# Patient Record
Sex: Female | Born: 1960 | Race: White | Hispanic: No | Marital: Married | Smoking: Never smoker
Health system: Southern US, Community
[De-identification: ages and names within clinical notes are randomized; demographics above are authoritative.]

---

## 1990-11-18 HISTORY — PX: LIVER RESECTION: SHX1977

## 1998-04-25 ENCOUNTER — Ambulatory Visit (HOSPITAL_COMMUNITY): Admission: RE | Admit: 1998-04-25 | Discharge: 1998-04-25 | Payer: Self-pay | Admitting: Obstetrics and Gynecology

## 1998-04-25 ENCOUNTER — Other Ambulatory Visit: Admission: RE | Admit: 1998-04-25 | Discharge: 1998-04-25 | Payer: Self-pay | Admitting: Obstetrics and Gynecology

## 2001-07-24 ENCOUNTER — Other Ambulatory Visit: Admission: RE | Admit: 2001-07-24 | Discharge: 2001-07-24 | Payer: Self-pay | Admitting: Obstetrics and Gynecology

## 2002-11-23 ENCOUNTER — Other Ambulatory Visit: Admission: RE | Admit: 2002-11-23 | Discharge: 2002-11-23 | Payer: Self-pay | Admitting: Obstetrics and Gynecology

## 2004-02-09 ENCOUNTER — Other Ambulatory Visit: Admission: RE | Admit: 2004-02-09 | Discharge: 2004-02-09 | Payer: Self-pay | Admitting: Obstetrics and Gynecology

## 2013-10-13 LAB — HM MAMMOGRAPHY

## 2014-10-28 LAB — HM MAMMOGRAPHY

## 2015-10-31 LAB — HM MAMMOGRAPHY

## 2016-01-15 LAB — HM COLONOSCOPY

## 2017-04-29 LAB — HM MAMMOGRAPHY

## 2017-06-03 LAB — HM PAP SMEAR: HM Pap smear: NEGATIVE

## 2018-09-08 ENCOUNTER — Ambulatory Visit: Payer: Self-pay | Admitting: Adult Health

## 2018-10-02 ENCOUNTER — Encounter: Payer: Self-pay | Admitting: Family Medicine

## 2018-10-02 ENCOUNTER — Ambulatory Visit: Payer: 59 | Admitting: Family Medicine

## 2018-10-02 VITALS — BP 148/88 | HR 70 | Temp 98.5°F | Ht 63.5 in | Wt 189.5 lb

## 2018-10-02 DIAGNOSIS — R638 Other symptoms and signs concerning food and fluid intake: Secondary | ICD-10-CM | POA: Diagnosis not present

## 2018-10-02 DIAGNOSIS — Z7689 Persons encountering health services in other specified circumstances: Secondary | ICD-10-CM

## 2018-10-02 DIAGNOSIS — Z833 Family history of diabetes mellitus: Secondary | ICD-10-CM | POA: Insufficient documentation

## 2018-10-02 DIAGNOSIS — Z8261 Family history of arthritis: Secondary | ICD-10-CM | POA: Insufficient documentation

## 2018-10-02 DIAGNOSIS — E669 Obesity, unspecified: Secondary | ICD-10-CM | POA: Diagnosis not present

## 2018-10-02 DIAGNOSIS — R030 Elevated blood-pressure reading, without diagnosis of hypertension: Secondary | ICD-10-CM | POA: Diagnosis not present

## 2018-10-02 DIAGNOSIS — Z800 Family history of malignant neoplasm of digestive organs: Secondary | ICD-10-CM | POA: Insufficient documentation

## 2018-10-02 DIAGNOSIS — Z8249 Family history of ischemic heart disease and other diseases of the circulatory system: Secondary | ICD-10-CM | POA: Insufficient documentation

## 2018-10-02 DIAGNOSIS — Z8489 Family history of other specified conditions: Secondary | ICD-10-CM | POA: Insufficient documentation

## 2018-10-02 NOTE — Patient Instructions (Addendum)
Your goal blood pressure should be 135/85 or less on a regular basis, her medications should be started.  Normal blood pressure is 120/80 or less.  -Please check your blood pressure multiple times per week following the below recommendations and if not at goal, please follow-up sooner than planned.    Hypertension Hypertension, commonly called high blood pressure, is when the force of blood pumping through the arteries is too strong. The arteries are the blood vessels that carry blood from the heart throughout the body. Hypertension forces the heart to work harder to pump blood and may cause arteries to become narrow or stiff. Having untreated or uncontrolled hypertension can cause heart attacks, strokes, kidney disease, and other problems. A blood pressure reading consists of a higher number over a lower number. Ideally, your blood pressure should be below 120/80. The first ("top") number is called the systolic pressure. It is a measure of the pressure in your arteries as your heart beats. The second ("bottom") number is called the diastolic pressure. It is a measure of the pressure in your arteries as the heart relaxes. What are the causes? The cause of this condition is not known. What increases the risk? Some risk factors for high blood pressure are under your control. Others are not. Factors you can change  Smoking.  Having type 2 diabetes mellitus, high cholesterol, or both.  Not getting enough exercise or physical activity.  Being overweight.  Having too much fat, sugar, calories, or salt (sodium) in your diet.  Drinking too much alcohol. Factors that are difficult or impossible to change  Having chronic kidney disease.  Having a family history of high blood pressure.  Age. Risk increases with age.  Race. You may be at higher risk if you are African-American.  Gender. Men are at higher risk than women before age 73. After age 62, women are at higher risk than men.  Having  obstructive sleep apnea.  Stress. What are the signs or symptoms? Extremely high blood pressure (hypertensive crisis) may cause:  Headache.  Anxiety.  Shortness of breath.  Nosebleed.  Nausea and vomiting.  Severe chest pain.  Jerky movements you cannot control (seizures).  How is this diagnosed? This condition is diagnosed by measuring your blood pressure while you are seated, with your arm resting on a surface. The cuff of the blood pressure monitor will be placed directly against the skin of your upper arm at the level of your heart. It should be measured at least twice using the same arm. Certain conditions can cause a difference in blood pressure between your right and left arms. Certain factors can cause blood pressure readings to be lower or higher than normal (elevated) for a short period of time:  When your blood pressure is higher when you are in a health care provider's office than when you are at home, this is called white coat hypertension. Most people with this condition do not need medicines.  When your blood pressure is higher at home than when you are in a health care provider's office, this is called masked hypertension. Most people with this condition may need medicines to control blood pressure.  If you have a high blood pressure reading during one visit or you have normal blood pressure with other risk factors:  You may be asked to return on a different day to have your blood pressure checked again.  You may be asked to monitor your blood pressure at home for 1 week or longer.  If you are diagnosed with hypertension, you may have other blood or imaging tests to help your health care provider understand your overall risk for other conditions. How is this treated? This condition is treated by making healthy lifestyle changes, such as eating healthy foods, exercising more, and reducing your alcohol intake. Your health care provider may prescribe medicine if  lifestyle changes are not enough to get your blood pressure under control, and if:  Your systolic blood pressure is above 130.  Your diastolic blood pressure is above 80.  Your personal target blood pressure may vary depending on your medical conditions, your age, and other factors. Follow these instructions at home: Eating and drinking  Eat a diet that is high in fiber and potassium, and low in sodium, added sugar, and fat. An example eating plan is called the DASH (Dietary Approaches to Stop Hypertension) diet. To eat this way: ? Eat plenty of fresh fruits and vegetables. Try to fill half of your plate at each meal with fruits and vegetables. ? Eat whole grains, such as whole wheat pasta, brown rice, or whole grain bread. Fill about one quarter of your plate with whole grains. ? Eat or drink low-fat dairy products, such as skim milk or low-fat yogurt. ? Avoid fatty cuts of meat, processed or cured meats, and poultry with skin. Fill about one quarter of your plate with lean proteins, such as fish, chicken without skin, beans, eggs, and tofu. ? Avoid premade and processed foods. These tend to be higher in sodium, added sugar, and fat.  Reduce your daily sodium intake. Most people with hypertension should eat less than 1,500 mg of sodium a day.  Limit alcohol intake to no more than 1 drink a day for nonpregnant women and 2 drinks a day for men. One drink equals 12 oz of beer, 5 oz of wine, or 1 oz of hard liquor. Lifestyle  Work with your health care provider to maintain a healthy body weight or to lose weight. Ask what an ideal weight is for you.  Get at least 30 minutes of exercise that causes your heart to beat faster (aerobic exercise) most days of the week. Activities may include walking, swimming, or biking.  Include exercise to strengthen your muscles (resistance exercise), such as pilates or lifting weights, as part of your weekly exercise routine. Try to do these types of exercises  for 30 minutes at least 3 days a week.  Do not use any products that contain nicotine or tobacco, such as cigarettes and e-cigarettes. If you need help quitting, ask your health care provider.  Monitor your blood pressure at home as told by your health care provider.  Keep all follow-up visits as told by your health care provider. This is important. Medicines  Take over-the-counter and prescription medicines only as told by your health care provider. Follow directions carefully. Blood pressure medicines must be taken as prescribed.  Do not skip doses of blood pressure medicine. Doing this puts you at risk for problems and can make the medicine less effective.  Ask your health care provider about side effects or reactions to medicines that you should watch for. Contact a health care provider if:  You think you are having a reaction to a medicine you are taking.  You have headaches that keep coming back (recurring).  You feel dizzy.  You have swelling in your ankles.  You have trouble with your vision. Get help right away if:  You develop a severe headache  or confusion.  You have unusual weakness or numbness.  You feel faint.  You have severe pain in your chest or abdomen.  You vomit repeatedly.  You have trouble breathing. Summary  Hypertension is when the force of blood pumping through your arteries is too strong. If this condition is not controlled, it may put you at risk for serious complications.  Your personal target blood pressure may vary depending on your medical conditions, your age, and other factors. For most people, a normal blood pressure is less than 120/80.  Hypertension is treated with lifestyle changes, medicines, or a combination of both. Lifestyle changes include weight loss, eating a healthy, low-sodium diet, exercising more, and limiting alcohol. This information is not intended to replace advice given to you by your health care provider. Make sure you  discuss any questions you have with your health care provider. Document Released: 11/04/2005 Document Revised: 10/02/2016 Document Reviewed: 10/02/2016 Elsevier Interactive Patient Education  2018 [employer].    How to Take Your Blood Pressure   Blood pressure is a measurement of how strongly your blood is pressing against the walls of your arteries. Arteries are blood vessels that carry blood from your heart throughout your body. Your health care provider takes your blood pressure at each office visit. You can also take your own blood pressure at home with a blood pressure machine. You may need to take your own blood pressure:  To confirm a diagnosis of high blood pressure (hypertension).  To monitor your blood pressure over time.  To make sure your blood pressure medicine is working.  Supplies needed: To take your blood pressure, you will need a blood pressure machine. You can buy a blood pressure machine, or blood pressure monitor, at most drugstores or online. There are several types of home blood pressure monitors. When choosing one, consider the following:  Choose a monitor that has an arm cuff.  Choose a monitor that wraps snugly around your upper arm. You should be able to fit only one finger between your arm and the cuff.  Do not choose a monitor that measures your blood pressure from your wrist or finger.  Your health care provider can suggest a reliable monitor that will meet your needs. How to prepare To get the most accurate reading, avoid the following for 30 minutes before you check your blood pressure:  Drinking caffeine.  Drinking alcohol.  Eating.  Smoking.  Exercising.  Five minutes before you check your blood pressure:  Empty your bladder.  Sit quietly without talking in a dining chair, rather than in a soft couch or armchair.  How to take your blood pressure To check your blood pressure, follow the instructions in the manual that came with your  blood pressure monitor. If you have a digital blood pressure monitor, the instructions may be as follows: 1. Sit up straight. 2. Place your feet on the floor. Do not cross your ankles or legs. 3. Rest your left arm at the level of your heart on a table or desk or on the arm of a chair. 4. Pull up your shirt sleeve. 5. Wrap the blood pressure cuff around the upper part of your left arm, 1 inch (2.5 cm) above your elbow. It is best to wrap the cuff around bare skin. 6. Fit the cuff snugly around your arm. You should be able to place only one finger between the cuff and your arm. 7. Position the cord inside the groove of your elbow. 8.  Press the power button. 9. Sit quietly while the cuff inflates and deflates. 10. Read the digital reading on the monitor screen and write it down (record it). 11. Wait 2-3 minutes, then repeat the steps, starting at step 1.  What does my blood pressure reading mean? A blood pressure reading consists of a higher number over a lower number. Ideally, your blood pressure should be below 120/80. The first ("top") number is called the systolic pressure. It is a measure of the pressure in your arteries as your heart beats. The second ("bottom") number is called the diastolic pressure. It is a measure of the pressure in your arteries as the heart relaxes. Blood pressure is classified into four stages. The following are the stages for adults who do not have a short-term serious illness or a chronic condition. Systolic pressure and diastolic pressure are measured in a unit called mm Hg. Normal  Systolic pressure: below 120.  Diastolic pressure: below 80. Elevated  Systolic pressure: 120-129.  Diastolic pressure: below 80. Hypertension stage 1  Systolic pressure: 130-139.  Diastolic pressure: 80-89. Hypertension stage 2  Systolic pressure: 140 or above.  Diastolic pressure: 90 or above. You can have prehypertension or hypertension even if only the systolic or  only the diastolic number in your reading is higher than normal. Follow these instructions at home:  Check your blood pressure as often as recommended by your health care provider.  Take your monitor to the next appointment with your health care provider to make sure: ? That you are using it correctly. ? That it provides accurate readings.  Be sure you understand what your goal blood pressure numbers are.  Tell your health care provider if you are having any side effects from blood pressure medicine. Contact a health care provider if:  Your blood pressure is consistently high. Get help right away if:  Your systolic blood pressure is higher than 180.  Your diastolic blood pressure is higher than 110. This information is not intended to replace advice given to you by your health care provider. Make sure you discuss any questions you have with your health care provider. Document Released: 04/12/2016 Document Revised: 06/25/2016 Document Reviewed: 04/12/2016 Elsevier Interactive Patient Education  2018 [employer]      Please realize, EXERCISE IS MEDICINE!  -  American Heart Association ( AHA) guidelines for exercise : If you are in good health, without any medical conditions, you should engage in 150-300 minutes of moderate intensity aerobic activity per week.  This means you should be huffing and puffing throughout your workout.   Engaging in regular exercise will improve brain function and memory, as well as improve mood, boost immune system and help with weight management.  As well as the other, more well-known effects of exercise such as decreasing blood sugar levels, decreasing blood pressure,  and decreasing bad cholesterol levels/ increasing good cholesterol levels.     -  The AHA strongly endorses consumption of a diet that contains a variety of foods from all the food categories with an emphasis on fruits and vegetables; fat-free and low-fat dairy products; cereal and grain  products; legumes and nuts; and fish, poultry, and/or extra lean meats.    Excessive food intake, especially of foods high in saturated and trans fats, sugar, and salt, should be avoided.    Adequate water intake of roughly 1/2 of your weight in pounds, should equal the ounces of water per day you should drink.  So for instance, if you're  200 pounds, that would be 100 ounces of water per day.         Mediterranean Diet  Why follow it? Research shows  Those who follow the Mediterranean diet have a reduced risk of heart disease   The diet is associated with a reduced incidence of Parkinson's and Alzheimer's diseases  People following the diet may have longer life expectancies and lower rates of chronic diseases   The Dietary Guidelines for Americans recommends the Mediterranean diet as an eating plan to promote health and prevent disease  What Is the Mediterranean Diet?   Healthy eating plan based on typical foods and recipes of Mediterranean-style cooking  The diet is primarily a plant based diet; these foods should make up a majority of meals   Starches - Plant based foods should make up a majority of meals - They are an important sources of vitamins, minerals, energy, antioxidants, and fiber - Choose whole grains, foods high in fiber and minimally processed items  - Typical grain sources include wheat, oats, barley, corn, brown rice, bulgar, farro, millet, polenta, couscous  - Various types of beans include chickpeas, lentils, fava beans, black beans, white beans   Fruits  Veggies - Large quantities of antioxidant rich fruits & veggies; 6 or more servings  - Vegetables can be eaten raw or lightly drizzled with oil and cooked  - Vegetables common to the traditional Mediterranean Diet include: artichokes, arugula, beets, broccoli, brussel sprouts, cabbage, carrots, celery, collard greens, cucumbers, eggplant, kale, leeks, lemons, lettuce, mushrooms, okra, onions, peas, peppers,  potatoes, pumpkin, radishes, rutabaga, shallots, spinach, sweet potatoes, turnips, zucchini - Fruits common to the Mediterranean Diet include: apples, apricots, avocados, cherries, clementines, dates, figs, grapefruits, grapes, melons, nectarines, oranges, peaches, pears, pomegranates, strawberries, tangerines  Fats - Replace butter and margarine with healthy oils, such as olive oil, canola oil, and tahini  - Limit nuts to no more than a handful a day  - Nuts include walnuts, almonds, pecans, pistachios, pine nuts  - Limit or avoid candied, honey roasted or heavily salted nuts - Olives are central to the [employer] - can be eaten whole or used in a variety of dishes   Meats Protein - Limiting red meat: no more than a few times a month - When eating red meat: choose lean cuts and keep the portion to the size of deck of cards - Eggs: approx. 0 to 4 times a week  - Fish and lean poultry: at least 2 a week  - Healthy protein sources include, chicken, [place], lean beef, lamb - Increase intake of seafood such as tuna, salmon, trout, mackerel, shrimp, scallops - Avoid or limit high fat processed meats such as sausage and bacon  Dairy - Include moderate amounts of low fat dairy products  - Focus on healthy dairy such as fat free yogurt, skim milk, low or reduced fat cheese - Limit dairy products higher in fat such as whole or 2% milk, cheese, ice cream  Alcohol - Moderate amounts of red wine is ok  - No more than 5 oz daily for women (all ages) and men older than age 51  - No more than 10 oz of wine daily for men younger than 61  Other - Limit sweets and other desserts  - Use herbs and spices instead of salt to flavor foods  - Herbs and spices common to the traditional Mediterranean Diet include: basil, bay leaves, chives, cloves, cumin, fennel, garlic, lavender, marjoram, mint, oregano, parsley, pepper,  rosemary, sage, savory, sumac, tarragon, thyme   Its not just a diet, its a lifestyle:    The Mediterranean diet includes lifestyle factors typical of those in the region   Foods, drinks and meals are best eaten with others and savored  Daily physical activity is important for overall good health  This could be strenuous exercise like running and aerobics  This could also be more leisurely activities such as walking, housework, yard-work, or taking the stairs  Moderation is the key; a balanced and healthy diet accommodates most foods and drinks  Consider portion sizes and frequency of consumption of certain foods   Meal Ideas & Options:   Breakfast:  o Whole wheat toast or whole wheat English muffins with peanut butter & hard boiled egg o Steel cut oats topped with apples & cinnamon and skim milk  o Fresh fruit: banana, strawberries, melon, berries, peaches  o Smoothies: strawberries, bananas, greek yogurt, peanut butter o Low fat greek yogurt with blueberries and granola  o Egg white omelet with spinach and mushrooms o Breakfast couscous: whole wheat couscous, apricots, skim milk, cranberries   Sandwiches:  o Hummus and grilled vegetables (peppers, zucchini, squash) on whole wheat bread   o Grilled chicken on whole wheat pita with lettuce, tomatoes, cucumbers or tzatziki  o Tuna salad on whole wheat bread: tuna salad made with greek yogurt, olives, red peppers, capers, green onions o Garlic rosemary lamb pita: lamb sauted with garlic, rosemary, salt & pepper; add lettuce, cucumber, greek yogurt to pita - flavor with lemon juice and black pepper   Seafood:  o Mediterranean grilled salmon, seasoned with garlic, basil, parsley, lemon juice and black pepper o Shrimp, lemon, and spinach whole-grain pasta salad made with low fat greek yogurt  o Seared scallops with lemon orzo  o Seared tuna steaks seasoned salt, pepper, coriander topped with tomato mixture of olives, tomatoes, olive oil, minced garlic, parsley, green onions and cappers   Meats:  o Herbed greek  chicken salad with kalamata olives, cucumber, feta  o Red bell peppers stuffed with spinach, bulgur, lean ground beef (or lentils) & topped with feta   o Kebabs: skewers of chicken, tomatoes, onions, zucchini, squash  o [place] burgers: made with red onions, mint, dill, lemon juice, feta cheese topped with roasted red peppers  Vegetarian o Cucumber salad: cucumbers, artichoke hearts, celery, red onion, feta cheese, tossed in olive oil & lemon juice  o Hummus and whole grain pita points with a greek salad (lettuce, tomato, feta, olives, cucumbers, red onion) o Lentil soup with celery, carrots made with vegetable broth, garlic, salt and pepper  o Tabouli salad: parsley, bulgur, mint, scallions, cucumbers, tomato, radishes, lemon juice, olive oil, salt and pepper.

## 2018-10-02 NOTE — Progress Notes (Signed)
New patient office visit note:  Impression and Recommendations:    1. Encounter to establish care with new doctor   2. Obesity, Class I, BMI 30-34.9   3. Elevated systolic blood pressure reading without diagnosis of hypertension   4. Salt craving/  eats a lot of salt   5. Family history of diabetes mellitus (DM)- aunt   266. Family history of early death-  mother age 59 with history of RA   7. Family history of rheumatoid arthritis-mom   8. FHx: early coronary artery disease   9. Family history of primary liver cancer     - Extensive discussion held with patient regarding establishing as a new patient.  Discussed practices here at the clinic, and answered all questions about care team and health management during appointment.  - Discussed need for patient to continue to obtain screenings with her established specialists.  Educated patient at length about the critical importance of keeping her healthcare up to date.  - Participated in lengthy discussion with patient regarding all aspects of establishing care.  1. Elevated systolic blood pressure reading without diagnosis of hypertension - Patient was educated at length about hypertension in office today.  Spent significant amount of time counseling patient and answering all questions about high blood pressure.  - Reviewed goal BP as 120/80 or less on a regular basis.  - Lifestyle changes such as adequate hydration, dash diet, and engaging in a regular exercise program discussed with patient.  Educational handouts provided  - Ambulatory BP monitoring encouraged. Keep log and bring in next OV.  - Sit for 15-20 minutes prior to checking BP, with no  physical activity, caffeine, emotional or physical stimulation prior.  Check blood pressure Monday morning, Wednesday afternoon, Friday evening, and keep log of pressure and pulses.  - If blood pressure is up 140/90 or more on a regular basis, patient knows to call and ask to start  meds.  2. BMI Counseling - BMI of 33.0 Explained to patient what BMI refers to, and what it means medically.    Told patient to think about it as a "medical risk stratification measurement" and how increasing BMI is associated with increasing risk/ or worsening state of various diseases such as hypertension, hyperlipidemia, diabetes, premature OA, depression etc.  American Heart Association guidelines for healthy diet, basically Mediterranean diet, and exercise guidelines of 30 minutes 5 days per week or more discussed in detail.  Health counseling performed.  All questions answered.  3. Lifestyle & Preventative Health Maintenance - Advised patient to continue working toward exercising to improve overall mental, physical, and emotional health.  Discussed all benefits at length with patient.  - Reviewed the "spokes of the wheel" of mood and health management.  Stressed the importance of ongoing prudent habits, including regular exercise, appropriate sleep hygiene, healthful dietary habits, and prayer/meditation to relax.  - Encouraged patient to engage in daily physical activity, especially a formal exercise routine.  Recommended that the patient eventually strive for at least 150 minutes of moderate cardiovascular activity per week according to guidelines established by the [facility].   - Walk to goal of 45 minutes, 3 days per week, with increased cardiovascular intensity.  - Healthy dietary habits encouraged, including low-carb, and high amounts of lean protein in diet.   - Patient should also consume adequate amounts of water.   Education and routine counseling performed. Handouts provided.  4. Follow-Up - Prescriptions refilled today. - Re-check fasting lab  work as recommended. - Otherwise, continue to return for CPE and chronic follow-up as scheduled.   - Patient knows to call in sooner if desired to address acute concerns.    Return for CPE/ yrly physical, come fasting.  Please see  AVS handed out to patient at the end of our visit for further patient instructions/ counseling done pertaining to today's office visit.    Note:  This document was prepared using Dragon voice recognition software and may include unintentional dictation errors.   This document serves as a record of services personally performed by Thomasene Lot, DO. It was created on her behalf by Peggye Fothergill, a trained medical scribe. The creation of this record is based on the scribe's personal observations and the provider's statements to them.   I have reviewed the above medical documentation for accuracy and completeness and I concur.  Thomasene Lot, DO, D.O. 10/08/2018 9:11 AM       ------------------------------------------------------------------------------------------------------------------    Subjective:    Chief complaint:   Chief Complaint  Patient presents with  . Annual Exam    HPI: Diane Carter is a pleasant 57 y.o. female who presents to [facility] today to review their medical history with me and establish care.   I asked the patient to review their chronic problem list with me to ensure everything was updated and accurate.    All recent office visits with other providers, any medical records that patient brought in etc  - I reviewed today.     We asked pt to get [place] their medical records from [facility] providers/ specialists that they had seen within the past 3-5 years- if they are in private practice and/or do not work for [employer], [facility], [facility], [facility] or [facility] owned practice.  Told them to call their specialists to clarify this if they are not sure.    Establishing care as it has been years since her last visit to a PCP.  Patient did not want to establish at the practice in [place].  Went to a "hole-in-the-wall" sometimes.  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 3 kids; twin girls, age 69; son age 67. Her son is in [place]. One daughter is a Holiday representative in [facility]; the other one lives in [place].  Tobacco & Alcohol Use Never smoker, no alcohol.  Still sexually active with husband.  Less than one cup of soda. Doesn't drink coffee; mostly soda. A lot of days it's none, but some days it's one.  Rates her diet between good and fair. States "I do try to watch it, but I do like to eat sweets way more than I should, and hamburgers." States she loves salt, but has been watching her salt since she went to the dentist.  Wishes she were 50 lbs thinner "like I used to be." Just started walking with her husband, a few weeks ago. They walked 3-4 days per week before the time changed. Now they walk on Saturdays & Sundays.  Family History No known family  history of hypertension. Her mom died young; had rheumatoid arthritis. Patient's mother died at age 54 in 8. States "blood disorder or poison" on her mother's death certificate.  Dad died at age 3; he had cancer & was an alcoholic.  Grandfather had a heart attack. Was a truck driver "and popped speed all the time a long time ago when they drove 20 hours a day."  He was in his 35's.  Grandmother died at age 60.  Paternal aunt with early-onset diabetes. Aunt had a weird leiomyosarcoma; spleen or pancreas.  Denies other cancers, female cancers, etc.  Surgical History Past Surgical History:  Procedure Laterality Date  . CESAREAN SECTION  1995  . LIVER RESECTION  1992   Had a hematoma. "They told her never to take birth control pills again." May have been told that she formed  clots easily.  Past Medical History Hasn't been to a doctor in a while.   Feels it's been 5 years since her last blood work.  - Hypertension States that her blood pressure used to be "in the 140's." She's had no idea that her blood pressure has been elevated.  Was 163/70-something at the dentist recently.  Has a blood pressure cuff at home.  Notes "it's been all over the place." Says "It's not been high, maybe 140."  She is trying to walk with her husband more regularly because she would prefer to lose weight and does not want to start medications.  - OBGYN Has always done her pap smears and mammograms.  Sees Dr. Henderson Cloud.  - Dental Care Sees the dentist.  - GI Health & Colonoscopy Had a colonoscopy 2 years ago.  - Thyroid treated while pregnant Took thyroid medicine when she was pregnant.  Has had her thyroid checked since.  - Occasional heart palpitations Has palpitations every once in a while.  Feel that these occur due to stress.  - Secondhand smoke exposure Exposed to secondhand smoke as a kid.  States sometimes she gets laughing and feels like she starts to wheeze.    Wt Readings from Last 3 Encounters:  10/02/18 189 lb 8 oz (86 kg)   BP Readings from Last 3 Encounters:  10/02/18 (!) 148/88   Pulse Readings from Last 3 Encounters:  10/02/18 70   BMI Readings from Last 3 Encounters:  10/02/18 33.04 kg/m    Patient Care Team    Relationship Specialty Notifications Start End  Thomasene Lot, DO PCP - General Family Medicine  10/02/18   Harold Hedge, MD Consulting Physician Obstetrics and Gynecology  10/02/18     Patient Active Problem List   Diagnosis Date Noted  . Obesity, Class I, BMI 30-34.9 10/02/2018  . Family history of diabetes mellitus (DM)- aunt 10/02/2018  . Family history of early death-  mother age 48 with history of RA 10/02/2018  . Family history of rheumatoid arthritis-mom 10/02/2018  . FHx: early coronary artery disease  10/02/2018  . Family history of primary liver cancer 10/02/2018  . Elevated systolic blood pressure reading without diagnosis of hypertension 10/02/2018  . Salt craving/  eats a lot of salt 10/02/2018       As reported by pt:  History reviewed. No pertinent past medical history.   Past Surgical History:  Procedure Laterality Date  . CESAREAN SECTION  1995  . LIVER RESECTION  1992     Family History  Problem Relation Age of Onset  . Liver cancer Father   . Alcohol abuse Father   .  Diabetes type I Paternal Aunt   . Heart attack Other      Social History   Substance and Sexual Activity  Drug Use Never     Social History   Substance and Sexual Activity  Alcohol Use Never  . Frequency: Never     Social History   Tobacco Use  Smoking Status Never Smoker  Smokeless Tobacco Never Used     No outpatient medications have been marked as taking for the 10/02/18 encounter (Office Visit) with Thomasene Lot, DO.    Allergies: Patient has no known allergies.   Review of Systems  Constitutional: Negative for chills, diaphoresis, fever, malaise/fatigue and weight loss.  HENT: Negative for congestion, sore throat and tinnitus.   Eyes: Negative for blurred vision, double vision and photophobia.  Respiratory: Positive for cough (chronic) and wheezing (chronic).   Cardiovascular: Positive for palpitations (ocassionally). Negative for chest pain.  Gastrointestinal: Negative for blood in stool, diarrhea, nausea and vomiting.  Genitourinary: Negative for dysuria, frequency and urgency.  Musculoskeletal: Negative for joint pain and myalgias.  Skin: Negative for itching and rash.  Neurological: Negative for dizziness, focal weakness, weakness and headaches.  Endo/Heme/Allergies: Negative for environmental allergies and polydipsia. Does not bruise/bleed easily.  Psychiatric/Behavioral: Negative for depression and memory loss. The patient is not nervous/anxious and does  not have insomnia.         Objective:   Blood pressure (!) 148/88, pulse 70, temperature 98.5 F (36.9 C), height 5' 3.5" (1.613 m), weight 189 lb 8 oz (86 kg), SpO2 99 %. Body mass index is 33.04 kg/m. General: Well Developed, well nourished, and in no acute distress.  Neuro: Alert and oriented x3, extra-ocular muscles intact, sensation grossly intact.  HEENT:[place]/AT, PERRLA, neck supple, No carotid bruits Skin: no gross rashes  Cardiac: Regular rate and rhythm Respiratory: Essentially clear to auscultation bilaterally. Not using accessory muscles, speaking in full sentences.  Abdominal: not grossly distended Musculoskeletal: Ambulates w/o diff, FROM * 4 ext.  Vasc: less 2 sec cap RF, warm and pink  Psych:  No HI/SI, judgement and insight good, Euthymic mood. Full Affect.    No results found for this or any previous visit (from the past 2160 hour(s)).

## 2018-12-01 ENCOUNTER — Encounter: Payer: Self-pay | Admitting: Family Medicine

## 2018-12-01 ENCOUNTER — Ambulatory Visit (INDEPENDENT_AMBULATORY_CARE_PROVIDER_SITE_OTHER): Payer: 59 | Admitting: Family Medicine

## 2018-12-01 VITALS — BP 148/84 | HR 71 | Temp 97.9°F | Ht 63.75 in | Wt 186.6 lb

## 2018-12-01 DIAGNOSIS — Z0000 Encounter for general adult medical examination without abnormal findings: Secondary | ICD-10-CM

## 2018-12-01 DIAGNOSIS — Z833 Family history of diabetes mellitus: Secondary | ICD-10-CM | POA: Diagnosis not present

## 2018-12-01 DIAGNOSIS — Z719 Counseling, unspecified: Secondary | ICD-10-CM

## 2018-12-01 DIAGNOSIS — E669 Obesity, unspecified: Secondary | ICD-10-CM

## 2018-12-01 DIAGNOSIS — I10 Essential (primary) hypertension: Secondary | ICD-10-CM

## 2018-12-01 DIAGNOSIS — Z23 Encounter for immunization: Secondary | ICD-10-CM

## 2018-12-01 DIAGNOSIS — Z1239 Encounter for other screening for malignant neoplasm of breast: Secondary | ICD-10-CM

## 2018-12-01 MED ORDER — ZOSTER VAC RECOMB ADJUVANTED 50 MCG/0.5ML IM SUSR: 0.5000 mL | Freq: Once | INTRAMUSCULAR | 0 refills | Status: AC

## 2018-12-01 NOTE — Progress Notes (Signed)
Impression and Recommendations:    1. Health care maintenance/  ylry PE   2. Health education/counseling   3. Obesity, Class I, BMI 30-34.9   4. Family history of diabetes mellitus (DM)- aunt   5. Benign essential HTN   6. Need for shingles vaccine   7. Screening for breast cancer     - Need for lab work.  Fasting lab work drawn today. - Patient prefers to be called with the results of her recent lab work. - Even if labs are abnormal and patient needs med change, she desires to be called on phone.  1) Anticipatory Guidance: Discussed importance of wearing a seatbelt while driving, not texting while driving; sunscreen when outside along with yearly skin surveillance; eating a well balanced and modest diet; physical activity at least 25 minutes per day or 150 min/ week of moderate to intense activity.  - Reviewed prudent skin screening habits with patient today.  - Elevated blood pressure on intake.  Discussed ambulatory blood pressure monitoring at home and follow-up regarding blood pressure at next chronic health management visit.  2) Immunizations / Screenings / Labs:  All immunizations and screenings that patient agrees to, are up-to-date per recommendations or will be updated today.  Patient understands the needs for q 48mo dental and yearly vision screens which pt will schedule independently. Obtain CBC, CMP, HgA1c, Lipid panel, TSH and vit D when fasting if not already done recently.   - Discussed need for patient to continue to obtain management and screenings with all established specialists.  Educated patient at length about the critical importance of keeping health maintenance up to date.  - Patient knows to have all results of recent screenings sent here to clinic for review.  - Continue to obtain regular checkups through OBGYN as recommended.  - Obtain colonoscopy as advised, 5 years from last visit 2017.  - Need for shingles vaccine.  - Advised patient to refer to  Dr. Henderson Cloud regarding need for DEXA scan.  - Patient declines flu shot today.  3) Weight: Discussed goal of losing even 5-10% of current body weight which would improve overall feelings of well being and improve objective health data significantly.   Improve nutrient density of diet through increasing intake of fruits and vegetables and decreasing saturated/trans fats, white flour products and refined sugar products.   4) BMI Counseling - BMI of 32.28 Explained to patient what BMI refers to, and what it means medically.    Told patient to think about it as a "medical risk stratification measurement" and how increasing BMI is associated with increasing risk/ or worsening state of various diseases such as hypertension, hyperlipidemia, diabetes, premature OA, depression etc.  American Heart Association guidelines for healthy diet, basically Mediterranean diet, and exercise guidelines of 30 minutes 5 days per week or more discussed in detail.  Health counseling performed.  All questions answered.  5) Lifestyle & Preventative Health Maintenance - Advised patient to continue working toward exercising to improve overall mental, physical, and emotional health.    - Reviewed that exercise and prudent diet are "the best medicine."  - Reviewed the "spokes of the wheel" of mood and health management.  Stressed the importance of ongoing prudent habits, including regular exercise, appropriate sleep hygiene, healthful dietary habits, and prayer/meditation to relax.  - Encouraged patient to engage in daily physical activity, especially a formal exercise routine.  Recommended that the patient eventually strive for at least 150 minutes of moderate cardiovascular activity per  week according to guidelines established by the [facility].   - Healthy dietary habits encouraged, including low-carb, and high amounts of lean protein in diet.   - Patient should also consume adequate amounts of water.    Meds ordered this  encounter  Medications  . Zoster Vaccine Adjuvanted [facility]) injection    Sig: Inject 0.5 mLs into the muscle once for 1 dose.    Dispense:  0.5 mL    Refill:  0    Orders Placed This Encounter  Procedures  . MM Digital Screening  . CBC with Differential/Platelet  . Comprehensive metabolic panel  . Hemoglobin A1c  . Lipid panel  . T4, free  . TSH  . VITAMIN D 25 Hydroxy (Vit-D Deficiency, Fractures)    Gross side effects, risk and benefits, and alternatives of medications discussed with patient.  Patient is aware that all medications have potential side effects and we are unable to predict every side effect or drug-drug interaction that may occur.  Expresses verbal understanding and consents to current therapy plan and treatment regimen.  F-up preventative CPE in 1 year. F/up sooner for chronic care management as discussed and/or prn.  Please see orders placed and AVS handed out to patient at the end of our visit for further patient instructions/ counseling done pertaining to today's office visit.   This document serves as a record of services personally performed by Thomasene Lot, DO. It was created on her behalf by Peggye Fothergill, a trained medical scribe. The creation of this record is based on the scribe's personal observations and the provider's statements to them.   I have reviewed the above medical documentation for accuracy and completeness and I concur.  Thomasene Lot, DO 12/01/2018 5:07 PM        Subjective:    Chief Complaint  Patient presents with  . Annual Exam   CC:   HPI: Diane Carter is a 58 y.o. female who presents to [facility] at [facility] today a yearly health maintenance exam.  Health Maintenance Summary Reviewed and updated, unless pt declines services.  Colonoscopy:  Last colonoscopy was 2017.  Was told to return in 5 years.  States she had a polyp, but does not remember details. Tobacco History Reviewed:   Y; never  smoker. Alcohol:  No concerns, no excessive use Exercise Habits:  Not meeting AHA guidelines STD concerns:  None. Drug Use:  None. Birth control method:  Post-menopausal.  Menses regular:  Post-menopausal. Lumps or breast concerns:  No. Breast Cancer Family History:  No.  No gallbladder, but isn't sure if she has an appendix.  Female Health Patient follows up with OBGYN.  Visual Health Wears glasses and states she usually goes every two years to get her eyes checked.  Dental Health Patient visits the dentist every six months, goes to [facility], Dr. Shanon Ace.  Dermatological Health Does not follow up with dermatology.  Overall Health Patient has plans to be taking her vitamins, exercising, and improving her eating habits.  "I really don't want to go on meds."  I want to see where I am first.  Has no complaints.  States "I've been prescribed medicines before and they sit on the counter.  I just don't tame 'em."   There is no immunization history on file for this patient.  Health Maintenance  Topic Date Due  . PAP SMEAR-Modifier  12/01/2018 (Originally 07/06/1980)  . INFLUENZA VACCINE  09/24/2019 (Originally 06/18/2018)  . MAMMOGRAM  10/03/2019 (Originally 07/06/2009)  .  COLONOSCOPY  10/03/2019 (Originally 07/06/2009)  . TETANUS/TDAP  10/03/2019 (Originally 07/06/1978)  . Hepatitis C Screening  10/03/2019 (Originally 1961/06/22)  . HIV Screening  10/03/2019 (Originally 07/06/1974)     Wt Readings from Last 3 Encounters:  12/01/18 186 lb 9.6 oz (84.6 kg)  10/02/18 189 lb 8 oz (86 kg)   BP Readings from Last 3 Encounters:  12/01/18 (!) 148/84  10/02/18 (!) 148/88   Pulse Readings from Last 3 Encounters:  12/01/18 71  10/02/18 70     History reviewed. No pertinent past medical history.    Past Surgical History:  Procedure Laterality Date  . CESAREAN SECTION  1995  . LIVER RESECTION  1992      Family History  Problem Relation Age of Onset  . Liver cancer  Father   . Alcohol abuse Father   . Diabetes type I Paternal Aunt   . Heart attack Other       Social History   Substance and Sexual Activity  Drug Use Never  ,   Social History   Substance and Sexual Activity  Alcohol Use Never  . Frequency: Never  ,   Social History   Tobacco Use  Smoking Status Never Smoker  Smokeless Tobacco Never Used  ,   Social History   Substance and Sexual Activity  Sexual Activity Yes  . Partners: Male  . Birth control/protection: None    No current outpatient medications on file prior to visit.   No current facility-administered medications on file prior to visit.     Allergies: Patient has no known allergies.  Review of Systems: General:   Denies fever, chills, unexplained weight loss.  Optho/Auditory:   Denies visual changes, blurred vision/LOV Respiratory:   Denies SOB, DOE more than baseline levels.  Cardiovascular:   Denies chest pain, palpitations, new onset peripheral edema  Gastrointestinal:   Denies nausea, vomiting, diarrhea.  Genitourinary: Denies dysuria, freq/ urgency, flank pain or discharge from genitals.  Endocrine:     Denies hot or cold intolerance, polyuria, polydipsia. Musculoskeletal:   Denies unexplained myalgias, joint swelling, unexplained arthralgias, gait problems.  Skin:  Denies rash, suspicious lesions Neurological:     Denies dizziness, unexplained weakness, numbness  Psychiatric/Behavioral:   Denies mood changes, suicidal or homicidal ideations, hallucinations    Objective:    Blood pressure (!) 148/84, pulse 71, temperature 97.9 F (36.6 C), height 5' 3.75" (1.619 m), weight 186 lb 9.6 oz (84.6 kg), SpO2 99 %. Body mass index is 32.28 kg/m. General Appearance:    Alert, cooperative, no distress, appears stated age  Head:    Normocephalic, without obvious abnormality, atraumatic  Eyes:    PERRL, conjunctiva/corneas clear, EOM's intact, fundi    benign, both eyes  Ears:    Normal TM's and  external ear canals, both ears  Nose:   Nares normal, septum midline, mucosa normal, no drainage    or sinus tenderness  Throat:   Lips w/o lesion, mucosa moist, and tongue normal; teeth and   gums normal  Neck:   Supple, symmetrical, trachea midline, no adenopathy;    thyroid:  no enlargement/tenderness/nodules; no carotid   bruit or JVD  Back:     Symmetric, no curvature, ROM normal, no CVA tenderness  Lungs:     Clear to auscultation bilaterally, respirations unlabored, no       Wh/ R/ R  Chest Wall:    No tenderness or gross deformity; normal excursion   Heart:    Regular rate  and rhythm, S1 and S2 normal, no murmur, rub   or gallop  Breast Exam:    Deferred to OBGYN.  Abdomen:     Soft, non-tender, bowel sounds active all four quadrants, NO   G/R/R, no masses, no organomegaly  Genitalia:   Deferred to OBGYN.  Rectal:   Deferred to next colonoscopy.  Extremities:   Extremities normal, atraumatic, no cyanosis or gross edema  Pulses:   2+ and symmetric all extremities  Skin:   Warm, dry, Skin color, texture, turgor normal, no obvious rashes or lesions Psych: No HI/SI, judgement and insight good, Euthymic mood. Full Affect.  Neurologic:   CNII-XII intact, normal strength, sensation and reflexes    Throughout

## 2018-12-01 NOTE — Patient Instructions (Signed)
Preventive Care for Adults, Female  A healthy lifestyle and preventive care can promote health and wellness. Preventive health guidelines for women include the following key practices.   A routine yearly physical is a good way to check with your health care provider about your health and preventive screening. It is a chance to share any concerns and updates on your health and to receive a thorough exam.   Visit your dentist for a routine exam and preventive care every 6 months. Brush your teeth twice a day and floss once a day. Good oral hygiene prevents tooth decay and gum disease.   The frequency of eye exams is based on your age, health, family medical history, use of contact lenses, and other factors. Follow your health care provider's recommendations for frequency of eye exams.   Eat a healthy diet. Foods like vegetables, fruits, whole grains, low-fat dairy products, and lean protein foods contain the nutrients you need without too many calories. Decrease your intake of foods high in solid fats, added sugars, and salt. Eat the right amount of calories for you.Get information about a proper diet from your health care provider, if necessary.   Regular physical exercise is one of the most important things you can do for your health. Most adults should get at least 150 minutes of moderate-intensity exercise (any activity that increases your heart rate and causes you to sweat) each week. In addition, most adults need muscle-strengthening exercises on 2 or more days a week.   Maintain a healthy weight. The body mass index (BMI) is a screening tool to identify possible weight problems. It provides an estimate of body fat based on height and weight. Your health care provider can find your BMI, and can help you achieve or maintain a healthy weight.For adults 20 years and older:   - A BMI below 18.5 is considered underweight.   - A BMI of 18.5 to 24.9 is normal.   - A BMI of 25 to 29.9 is  considered overweight.   - A BMI of 30 and above is considered obese.   Maintain normal blood lipids and cholesterol levels by exercising and minimizing your intake of trans and saturated fats.  Eat a balanced diet with plenty of fruit and vegetables. Blood tests for lipids and cholesterol should begin at age 20 and be repeated every 5 years minimum.  If your lipid or cholesterol levels are high, you are over 40, or you are at high risk for heart disease, you may need your cholesterol levels checked more frequently.Ongoing high lipid and cholesterol levels should be treated with medicines if diet and exercise are not working.   If you smoke, find out from your health care provider how to quit. If you do not use tobacco, do not start.   Lung cancer screening is recommended for adults aged 55-80 years who are at high risk for developing lung cancer because of a history of smoking. A yearly low-dose CT scan of the lungs is recommended for people who have at least a 30-pack-year history of smoking and are a current smoker or have quit within the past 15 years. A pack year of smoking is smoking an average of 1 pack of cigarettes a day for 1 year (for example: 1 pack a day for 30 years or 2 packs a day for 15 years). Yearly screening should continue until the smoker has stopped smoking for at least 15 years. Yearly screening should be stopped for people who develop a   health problem that would prevent them from having lung cancer treatment.   If you are pregnant, do not drink alcohol. If you are breastfeeding, be very cautious about drinking alcohol. If you are not pregnant and choose to drink alcohol, do not have more than 1 drink per day. One drink is considered to be 12 ounces (355 mL) of beer, 5 ounces (148 mL) of wine, or 1.5 ounces (44 mL) of liquor.   Avoid use of street drugs. Do not share needles with anyone. Ask for help if you need support or instructions about stopping the use of  drugs.   High blood pressure causes heart disease and increases the risk of stroke. Your blood pressure should be checked at least yearly.  Ongoing high blood pressure should be treated with medicines if weight loss and exercise do not work.   If you are 69-55 years old, ask your health care provider if you should take aspirin to prevent strokes.   Diabetes screening involves taking a blood sample to check your fasting blood sugar level. This should be done once every 3 years, after age 38, if you are within normal weight and without risk factors for diabetes. Testing should be considered at a younger age or be carried out more frequently if you are overweight and have at least 1 risk factor for diabetes.   Breast cancer screening is essential preventive care for women. You should practice "breast self-awareness."  This means understanding the normal appearance and feel of your breasts and may include breast self-examination.  Any changes detected, no matter how small, should be reported to a health care provider.  Women in their 80s and 30s should have a clinical breast exam (CBE) by a health care provider as part of a regular health exam every 1 to 3 years.  After age 66, women should have a CBE every year.  Starting at age 1, women should consider having a mammogram (breast X-ray test) every year.  Women who have a family history of breast cancer should talk to their health care provider about genetic screening.  Women at a high risk of breast cancer should talk to their health care providers about having an MRI and a mammogram every year.   -Breast cancer gene (BRCA)-related cancer risk assessment is recommended for women who have family members with BRCA-related cancers. BRCA-related cancers include breast, ovarian, tubal, and peritoneal cancers. Having family members with these cancers may be associated with an increased risk for harmful changes (mutations) in the breast cancer genes BRCA1 and  BRCA2. Results of the assessment will determine the need for genetic counseling and BRCA1 and BRCA2 testing.   The Pap test is a screening test for cervical cancer. A Pap test can show cell changes on the cervix that might become cervical cancer if left untreated. A Pap test is a procedure in which cells are obtained and examined from the lower end of the uterus (cervix).   - Women should have a Pap test starting at age 57.   - Between ages 90 and 70, Pap tests should be repeated every 2 years.   - Beginning at age 63, you should have a Pap test every 3 years as long as the past 3 Pap tests have been normal.   - Some women have medical problems that increase the chance of getting cervical cancer. Talk to your health care provider about these problems. It is especially important to talk to your health care provider if a  new problem develops soon after your last Pap test. In these cases, your health care provider may recommend more frequent screening and Pap tests.   - The above recommendations are the same for women who have or have not gotten the vaccine for human papillomavirus (HPV).   - If you had a hysterectomy for a problem that was not cancer or a condition that could lead to cancer, then you no longer need Pap tests. Even if you no longer need a Pap test, a regular exam is a good idea to make sure no other problems are starting.   - If you are between ages 36 and 66 years, and you have had normal Pap tests going back 10 years, you no longer need Pap tests. Even if you no longer need a Pap test, a regular exam is a good idea to make sure no other problems are starting.   - If you have had past treatment for cervical cancer or a condition that could lead to cancer, you need Pap tests and screening for cancer for at least 20 years after your treatment.   - If Pap tests have been discontinued, risk factors (such as a new sexual partner) need to be reassessed to determine if screening should  be resumed.   - The HPV test is an additional test that may be used for cervical cancer screening. The HPV test looks for the virus that can cause the cell changes on the cervix. The cells collected during the Pap test can be tested for HPV. The HPV test could be used to screen women aged 70 years and older, and should be used in women of any age who have unclear Pap test results. After the age of 67, women should have HPV testing at the same frequency as a Pap test.   Colorectal cancer can be detected and often prevented. Most routine colorectal cancer screening begins at the age of 57 years and continues through age 26 years. However, your health care provider may recommend screening at an earlier age if you have risk factors for colon cancer. On a yearly basis, your health care provider may provide home test kits to check for hidden blood in the stool.  Use of a small camera at the end of a tube, to directly examine the colon (sigmoidoscopy or colonoscopy), can detect the earliest forms of colorectal cancer. Talk to your health care provider about this at age 23, when routine screening begins. Direct exam of the colon should be repeated every 5 -10 years through age 49 years, unless early forms of pre-cancerous polyps or small growths are found.   People who are at an increased risk for hepatitis B should be screened for this virus. You are considered at high risk for hepatitis B if:  -You were born in a country where hepatitis B occurs often. Talk with your health care provider about which countries are considered high risk.  - Your parents were born in a high-risk country and you have not received a shot to protect against hepatitis B (hepatitis B vaccine).  - You have HIV or AIDS.  - You use needles to inject street drugs.  - You live with, or have sex with, someone who has Hepatitis B.  - You get hemodialysis treatment.  - You take certain medicines for conditions like cancer, organ  transplantation, and autoimmune conditions.   Hepatitis C blood testing is recommended for all people born from [birth year] through 1965 and any individual  with known risks for hepatitis C.   Practice safe sex. Use condoms and avoid high-risk sexual practices to reduce the spread of sexually transmitted infections (STIs). STIs include gonorrhea, chlamydia, syphilis, trichomonas, herpes, HPV, and human immunodeficiency virus (HIV). Herpes, HIV, and HPV are viral illnesses that have no cure. They can result in disability, cancer, and death. Sexually active women aged 25 years and younger should be checked for chlamydia. Older women with new or multiple partners should also be tested for chlamydia. Testing for other STIs is recommended if you are sexually active and at increased risk.   Osteoporosis is a disease in which the bones lose minerals and strength with aging. This can result in serious bone fractures or breaks. The risk of osteoporosis can be identified using a bone density scan. Women ages 65 years and over and women at risk for fractures or osteoporosis should discuss screening with their health care providers. Ask your health care provider whether you should take a calcium supplement or vitamin D to There are also several preventive steps women can take to avoid osteoporosis and resulting fractures or to keep osteoporosis from worsening. -->Recommendations include:  Eat a balanced diet high in fruits, vegetables, calcium, and vitamins.  Get enough calcium. The recommended total intake of is 1,200 mg daily; for best absorption, if taking supplements, divide doses into 250-500 mg doses throughout the day. Of the two types of calcium, calcium carbonate is best absorbed when taken with food but calcium citrate can be taken on an empty stomach.  Get enough vitamin D. NAMS and the National Osteoporosis Foundation recommend at least 1,000 IU per day for women age 50 and over who are at risk of vitamin D  deficiency. Vitamin D deficiency can be caused by inadequate sun exposure (for example, those who live in northern latitudes).  Avoid alcohol and smoking. Heavy alcohol intake (more than 7 drinks per week) increases the risk of falls and hip fracture and women smokers tend to lose bone more rapidly and have lower bone mass than nonsmokers. Stopping smoking is one of the most important changes women can make to improve their health and decrease risk for disease.  Be physically active every day. Weight-bearing exercise (for example, fast walking, hiking, jogging, and weight training) may strengthen bones or slow the rate of bone loss that comes with aging. Balancing and muscle-strengthening exercises can reduce the risk of falling and fracture.  Consider therapeutic medications. Currently, several types of effective drugs are available. Healthcare providers can recommend the type most appropriate for each woman.  Eliminate environmental factors that may contribute to accidents. Falls cause nearly 90% of all osteoporotic fractures, so reducing this risk is an important bone-health strategy. Measures include ample lighting, removing obstructions to walking, using nonskid rugs on floors, and placing mats and/or grab bars in showers.  Be aware of medication side effects. Some common medicines make bones weaker. These include a type of steroid drug called glucocorticoids used for arthritis and asthma, some antiseizure drugs, certain sleeping pills, treatments for endometriosis, and some cancer drugs. An overactive thyroid gland or using too much thyroid hormone for an underactive thyroid can also be a problem. If you are taking these medicines, talk to your doctor about what you can do to help protect your bones.reduce the rate of osteoporosis.    Menopause can be associated with physical symptoms and risks. Hormone replacement therapy is available to decrease symptoms and risks. You should talk to your  health care provider   about whether hormone replacement therapy is right for you.   Use sunscreen. Apply sunscreen liberally and repeatedly throughout the day. You should seek shade when your shadow is shorter than you. Protect yourself by wearing long sleeves, pants, a wide-brimmed hat, and sunglasses year round, whenever you are outdoors.   Once a month, do a whole body skin exam, using a mirror to look at the skin on your back. Tell your health care provider of new moles, moles that have irregular borders, moles that are larger than a pencil eraser, or moles that have changed in shape or color.   -Stay current with required vaccines (immunizations).   Influenza vaccine. All adults should be immunized every year.  Tetanus, diphtheria, and acellular pertussis (Td, Tdap) vaccine. Pregnant women should receive 1 dose of Tdap vaccine during each pregnancy. The dose should be obtained regardless of the length of time since the last dose. Immunization is preferred during the 27th 36th week of gestation. An adult who has not previously received Tdap or who does not know her vaccine status should receive 1 dose of Tdap. This initial dose should be followed by tetanus and diphtheria toxoids (Td) booster doses every 10 years. Adults with an unknown or incomplete history of completing a 3-dose immunization series with Td-containing vaccines should begin or complete a primary immunization series including a Tdap dose. Adults should receive a Td booster every 10 years.  Varicella vaccine. An adult without evidence of immunity to varicella should receive 2 doses or a second dose if she has previously received 1 dose. Pregnant females who do not have evidence of immunity should receive the first dose after pregnancy. This first dose should be obtained before leaving the health care facility. The second dose should be obtained 4 8 weeks after the first dose.  Human papillomavirus (HPV) vaccine. Females aged 13 26  years who have not received the vaccine previously should obtain the 3-dose series. The vaccine is not recommended for use in pregnant females. However, pregnancy testing is not needed before receiving a dose. If a female is found to be pregnant after receiving a dose, no treatment is needed. In that case, the remaining doses should be delayed until after the pregnancy. Immunization is recommended for any person with an immunocompromised condition through the age of 26 years if she did not get any or all doses earlier. During the 3-dose series, the second dose should be obtained 4 8 weeks after the first dose. The third dose should be obtained 24 weeks after the first dose and 16 weeks after the second dose.  Zoster vaccine. One dose is recommended for adults aged 60 years or older unless certain conditions are present.  Measles, mumps, and rubella (MMR) vaccine. Adults born before 1957 generally are considered immune to measles and mumps. Adults born in 1957 or later should have 1 or more doses of MMR vaccine unless there is a contraindication to the vaccine or there is laboratory evidence of immunity to each of the three diseases. A routine second dose of MMR vaccine should be obtained at least 28 days after the first dose for students attending postsecondary schools, health care workers, or international travelers. People who received inactivated measles vaccine or an unknown type of measles vaccine during 1963 1967 should receive 2 doses of MMR vaccine. People who received inactivated mumps vaccine or an unknown type of mumps vaccine before 1979 and are at high risk for mumps infection should consider immunization with 2 doses of   MMR vaccine. For females of childbearing age, rubella immunity should be determined. If there is no evidence of immunity, females who are not pregnant should be vaccinated. If there is no evidence of immunity, females who are pregnant should delay immunization until after pregnancy.  Unvaccinated health care workers born before [birth year] who lack laboratory evidence of measles, mumps, or rubella immunity or laboratory confirmation of disease should consider measles and mumps immunization with 2 doses of MMR vaccine or rubella immunization with 1 dose of MMR vaccine.  Pneumococcal 13-valent conjugate (PCV13) vaccine. When indicated, a person who is uncertain of her immunization history and has no record of immunization should receive the PCV13 vaccine. An adult aged 54 years or older who has certain medical conditions and has not been previously immunized should receive 1 dose of PCV13 vaccine. This PCV13 should be followed with a dose of pneumococcal polysaccharide (PPSV23) vaccine. The PPSV23 vaccine dose should be obtained at least 8 weeks after the dose of PCV13 vaccine. An adult aged 58 years or older who has certain medical conditions and previously received 1 or more doses of PPSV23 vaccine should receive 1 dose of PCV13. The PCV13 vaccine dose should be obtained 1 or more years after the last PPSV23 vaccine dose.  Pneumococcal polysaccharide (PPSV23) vaccine. When PCV13 is also indicated, PCV13 should be obtained first. All adults aged 58 years and older should be immunized. An adult younger than age 65 years who has certain medical conditions should be immunized. Any person who resides in a nursing home or long-term care facility should be immunized. An adult smoker should be immunized. People with an immunocompromised condition and certain other conditions should receive both PCV13 and PPSV23 vaccines. People with human immunodeficiency virus (HIV) infection should be immunized as soon as possible after diagnosis. Immunization during chemotherapy or radiation therapy should be avoided. Routine use of PPSV23 vaccine is not recommended for American Indians, [place] Natives, or people younger than 65 years unless there are medical conditions that require PPSV23 vaccine. When indicated,  people who have unknown immunization and have no record of immunization should receive PPSV23 vaccine. One-time revaccination 5 years after the first dose of PPSV23 is recommended for people aged 70 64 years who have chronic kidney failure, nephrotic syndrome, asplenia, or immunocompromised conditions. People who received 1 2 doses of PPSV23 before age 32 years should receive another dose of PPSV23 vaccine at age 96 years or later if at least 5 years have passed since the previous dose. Doses of PPSV23 are not needed for people immunized with PPSV23 at or after age 55 years.  Meningococcal vaccine. Adults with asplenia or persistent complement component deficiencies should receive 2 doses of quadrivalent meningococcal conjugate (MenACWY-D) vaccine. The doses should be obtained at least 2 months apart. Microbiologists working with certain meningococcal bacteria, [employer] recruits, people at risk during an outbreak, and people who travel to or live in countries with a high rate of meningitis should be immunized. A first-year college student up through age 58 years who is living in a residence hall should receive a dose if she did not receive a dose on or after her 16th birthday. Adults who have certain high-risk conditions should receive one or more doses of vaccine.  Hepatitis A vaccine. Adults who wish to be protected from this disease, have certain high-risk conditions, work with hepatitis A-infected animals, work in hepatitis A research labs, or travel to or work in countries with a high rate of hepatitis A should be  immunized. Adults who were previously unvaccinated and who anticipate close contact with an international adoptee during the first 60 days after arrival in the [place] from a country with a high rate of hepatitis A should be immunized.  Hepatitis B vaccine.  Adults who wish to be protected from this disease, have certain high-risk conditions, may be exposed to blood or other infectious  body fluids, are household contacts or sex partners of hepatitis B positive people, are clients or workers in certain care facilities, or travel to or work in countries with a high rate of hepatitis B should be immunized.  Haemophilus influenzae type b (Hib) vaccine. A previously unvaccinated person with asplenia or sickle cell disease or having a scheduled splenectomy should receive 1 dose of Hib vaccine. Regardless of previous immunization, a recipient of a hematopoietic stem cell transplant should receive a 3-dose series 6 12 months after her successful transplant. Hib vaccine is not recommended for adults with HIV infection.  Preventive Services / Frequency Ages 6 to 39years  Blood pressure check.** / Every 1 to 2 years.  Lipid and cholesterol check.** / Every 5 years beginning at age 39.  Clinical breast exam.** / Every 3 years for women in their 61s and 62s.  BRCA-related cancer risk assessment.** / For women who have family members with a BRCA-related cancer (breast, ovarian, tubal, or peritoneal cancers).  Pap test.** / Every 2 years from ages 47 through 85. Every 3 years starting at age 34 through age 12 or 74 with a history of 3 consecutive normal Pap tests.  HPV screening.** / Every 3 years from ages 46 through ages 43 to 54 with a history of 3 consecutive normal Pap tests.  Hepatitis C blood test.** / For any individual with known risks for hepatitis C.  Skin self-exam. / Monthly.  Influenza vaccine. / Every year.  Tetanus, diphtheria, and acellular pertussis (Tdap, Td) vaccine.** / Consult your health care provider. Pregnant women should receive 1 dose of Tdap vaccine during each pregnancy. 1 dose of Td every 10 years.  Varicella vaccine.** / Consult your health care provider. Pregnant females who do not have evidence of immunity should receive the first dose after pregnancy.  HPV vaccine. / 3 doses over 6 months, if 64 and younger. The vaccine is not recommended for use in  pregnant females. However, pregnancy testing is not needed before receiving a dose.  Measles, mumps, rubella (MMR) vaccine.** / You need at least 1 dose of MMR if you were born in 1957 or later. You may also need a 2nd dose. For females of childbearing age, rubella immunity should be determined. If there is no evidence of immunity, females who are not pregnant should be vaccinated. If there is no evidence of immunity, females who are pregnant should delay immunization until after pregnancy.  Pneumococcal 13-valent conjugate (PCV13) vaccine.** / Consult your health care provider.  Pneumococcal polysaccharide (PPSV23) vaccine.** / 1 to 2 doses if you smoke cigarettes or if you have certain conditions.  Meningococcal vaccine.** / 1 dose if you are age 71 to 35 years and a Market researcher living in a residence hall, or have one of several medical conditions, you need to get vaccinated against meningococcal disease. You may also need additional booster doses.  Hepatitis A vaccine.** / Consult your health care provider.  Hepatitis B vaccine.** / Consult your health care provider.  Haemophilus influenzae type b (Hib) vaccine.** / Consult your health care provider.  Ages 55 to 64years  Blood pressure check.** / Every 1 to 2 years.  Lipid and cholesterol check.** / Every 5 years beginning at age 20 years.  Lung cancer screening. / Every year if you are aged 55 80 years and have a 30-pack-year history of smoking and currently smoke or have quit within the past 15 years. Yearly screening is stopped once you have quit smoking for at least 15 years or develop a health problem that would prevent you from having lung cancer treatment.  Clinical breast exam.** / Every year after age 40 years.  BRCA-related cancer risk assessment.** / For women who have family members with a BRCA-related cancer (breast, ovarian, tubal, or peritoneal cancers).  Mammogram.** / Every year beginning at age 40  years and continuing for as long as you are in good health. Consult with your health care provider.  Pap test.** / Every 3 years starting at age 30 years through age 65 or 70 years with a history of 3 consecutive normal Pap tests.  HPV screening.** / Every 3 years from ages 30 years through ages 65 to 70 years with a history of 3 consecutive normal Pap tests.  Fecal occult blood test (FOBT) of stool. / Every year beginning at age 50 years and continuing until age 75 years. You may not need to do this test if you get a colonoscopy every 10 years.  Flexible sigmoidoscopy or colonoscopy.** / Every 5 years for a flexible sigmoidoscopy or every 10 years for a colonoscopy beginning at age 50 years and continuing until age 75 years.  Hepatitis C blood test.** / For all people born from 1945 through 1965 and any individual with known risks for hepatitis C.  Skin self-exam. / Monthly.  Influenza vaccine. / Every year.  Tetanus, diphtheria, and acellular pertussis (Tdap/Td) vaccine.** / Consult your health care provider. Pregnant women should receive 1 dose of Tdap vaccine during each pregnancy. 1 dose of Td every 10 years.  Varicella vaccine.** / Consult your health care provider. Pregnant females who do not have evidence of immunity should receive the first dose after pregnancy.  Zoster vaccine.** / 1 dose for adults aged 60 years or older.  Measles, mumps, rubella (MMR) vaccine.** / You need at least 1 dose of MMR if you were born in 1957 or later. You may also need a 2nd dose. For females of childbearing age, rubella immunity should be determined. If there is no evidence of immunity, females who are not pregnant should be vaccinated. If there is no evidence of immunity, females who are pregnant should delay immunization until after pregnancy.  Pneumococcal 13-valent conjugate (PCV13) vaccine.** / Consult your health care provider.  Pneumococcal polysaccharide (PPSV23) vaccine.** / 1 to 2 doses if  you smoke cigarettes or if you have certain conditions.  Meningococcal vaccine.** / Consult your health care provider.  Hepatitis A vaccine.** / Consult your health care provider.  Hepatitis B vaccine.** / Consult your health care provider.  Haemophilus influenzae type b (Hib) vaccine.** / Consult your health care provider.  Ages 65 years and over  Blood pressure check.** / Every 1 to 2 years.  Lipid and cholesterol check.** / Every 5 years beginning at age 20 years.  Lung cancer screening. / Every year if you are aged 55 80 years and have a 30-pack-year history of smoking and currently smoke or have quit within the past 15 years. Yearly screening is stopped once you have quit smoking for at least 15 years or develop a health problem that   would prevent you from having lung cancer treatment.  Clinical breast exam.** / Every year after age 103 years.  BRCA-related cancer risk assessment.** / For women who have family members with a BRCA-related cancer (breast, ovarian, tubal, or peritoneal cancers).  Mammogram.** / Every year beginning at age 36 years and continuing for as long as you are in good health. Consult with your health care provider.  Pap test.** / Every 3 years starting at age 5 years through age 85 or 10 years with 3 consecutive normal Pap tests. Testing can be stopped between 65 and 70 years with 3 consecutive normal Pap tests and no abnormal Pap or HPV tests in the past 10 years.  HPV screening.** / Every 3 years from ages 93 years through ages 70 or 45 years with a history of 3 consecutive normal Pap tests. Testing can be stopped between 65 and 70 years with 3 consecutive normal Pap tests and no abnormal Pap or HPV tests in the past 10 years.  Fecal occult blood test (FOBT) of stool. / Every year beginning at age 8 years and continuing until age 45 years. You may not need to do this test if you get a colonoscopy every 10 years.  Flexible sigmoidoscopy or colonoscopy.** /  Every 5 years for a flexible sigmoidoscopy or every 10 years for a colonoscopy beginning at age 69 years and continuing until age 68 years.  Hepatitis C blood test.** / For all people born from [birth year] through 1967 and any individual with known risks for hepatitis C.  Osteoporosis screening.** / A one-time screening for women ages 7 years and over and women at risk for fractures or osteoporosis.  Skin self-exam. / Monthly.  Influenza vaccine. / Every year.  Tetanus, diphtheria, and acellular pertussis (Tdap/Td) vaccine.** / 1 dose of Td every 10 years.  Varicella vaccine.** / Consult your health care provider.  Zoster vaccine.** / 1 dose for adults aged 5 years or older.  Pneumococcal 13-valent conjugate (PCV13) vaccine.** / Consult your health care provider.  Pneumococcal polysaccharide (PPSV23) vaccine.** / 1 dose for all adults aged 74 years and older.  Meningococcal vaccine.** / Consult your health care provider.  Hepatitis A vaccine.** / Consult your health care provider.  Hepatitis B vaccine.** / Consult your health care provider.  Haemophilus influenzae type b (Hib) vaccine.** / Consult your health care provider. ** Family history and personal history of risk and conditions may change your health care provider's recommendations. Document Released: 12/31/2001 Document Revised: 08/25/2013  [facility] Patient Information 2014 [place], [place].   EXERCISE AND DIET:  We recommended that you start or continue a regular exercise program for good health. Regular exercise means any activity that makes your heart beat faster and makes you sweat.  We recommend exercising at least 30 minutes per day at least 3 days a week, preferably 5.  We also recommend a diet low in fat and sugar / carbohydrates.  Inactivity, poor dietary choices and obesity can cause diabetes, heart attack, stroke, and kidney damage, among others.     ALCOHOL AND SMOKING:  Women should limit their alcohol intake to no  more than 7 drinks/beers/glasses of wine (combined, not each!) per week. Moderation of alcohol intake to this level decreases your risk of breast cancer and liver damage.  ( And of course, no recreational drugs are part of a healthy lifestyle.)  Also, you should not be smoking at all or even being exposed to second hand smoke. Most people know smoking can  cause cancer, and various heart and lung diseases, but did you know it also contributes to weakening of your bones?  Aging of your skin?  Yellowing of your teeth and nails?   CALCIUM AND VITAMIN D:  Adequate intake of calcium and Vitamin D are recommended.  The recommendations for exact amounts of these supplements seem to change often, but generally speaking 600 mg of calcium (either carbonate or citrate) and 800 units of Vitamin D per day seems prudent. Certain women may benefit from higher intake of Vitamin D.  If you are among these women, your doctor will have told you during your visit.     PAP SMEARS:  Pap smears, to check for cervical cancer or precancers,  have traditionally been done yearly, although recent scientific advances have shown that most women can have pap smears less often.  However, every woman still should have a physical exam from her gynecologist or primary care physician every year. It will include a breast check, inspection of the vulva and vagina to check for abnormal growths or skin changes, a visual exam of the cervix, and then an exam to evaluate the size and shape of the uterus and ovaries.  And after 60 years of age, a rectal exam is indicated to check for rectal cancers. We will also provide age appropriate advice regarding health maintenance, like when you should have certain vaccines, screening for sexually transmitted diseases, bone density testing, colonoscopy, mammograms, etc.    MAMMOGRAMS:  All women over 20 years old should have a yearly mammogram. Many facilities now offer a "3D" mammogram, which may cost  around $50 extra out of pocket. If possible,  we recommend you accept the option to have the 3D mammogram performed.  It both reduces the number of women who will be called back for extra views which then turn out to be normal, and it is better than the routine mammogram at detecting truly abnormal areas.     COLONOSCOPY:  Colonoscopy to screen for colon cancer is recommended for all women at age 34.  We know, you hate the idea of the prep.  We agree, BUT, having colon cancer and not knowing it is worse!!  Colon cancer so often starts as a polyp that can be seen and removed at colonscopy, which can quite literally save your life!  And if your first colonoscopy is normal and you have no family history of colon cancer, most women don't have to have it again for 10 years.  Once every ten years, you can do something that may end up saving your life, right?  We will be happy to help you get it scheduled when you are ready.  Be sure to check your insurance coverage so you understand how much it will cost.  It may be covered as a preventative service at no cost, but you should check your particular policy.

## 2018-12-02 ENCOUNTER — Encounter: Payer: Self-pay | Admitting: Family Medicine

## 2018-12-02 LAB — COMPREHENSIVE METABOLIC PANEL
A/G RATIO: 1.6 (ref 1.2–2.2)
ALK PHOS: 104 IU/L (ref 39–117)
ALT: 24 IU/L (ref 0–32)
AST: 27 IU/L (ref 0–40)
Albumin: 4.6 g/dL (ref 3.5–5.5)
BILIRUBIN TOTAL: 0.7 mg/dL (ref 0.0–1.2)
BUN/Creatinine Ratio: 15 (ref 9–23)
BUN: 11 mg/dL (ref 6–24)
CHLORIDE: 98 mmol/L (ref 96–106)
CO2: 24 mmol/L (ref 20–29)
Calcium: 9.7 mg/dL (ref 8.7–10.2)
Creatinine, Ser: 0.72 mg/dL (ref 0.57–1.00)
GFR calc Af Amer: 106 mL/min/{1.73_m2} (ref >59)
GFR calc non Af Amer: 92 mL/min/{1.73_m2} (ref >59)
Globulin, Total: 2.9 g/dL (ref 1.5–4.5)
Glucose: 95 mg/dL (ref 65–99)
Potassium: 4.4 mmol/L (ref 3.5–5.2)
Sodium: 138 mmol/L (ref 134–144)
Total Protein: 7.5 g/dL (ref 6.0–8.5)

## 2018-12-02 LAB — CBC WITH DIFFERENTIAL/PLATELET
Basophils Absolute: 0.1 10*3/uL (ref 0.0–0.2)
Basos: 1 %
EOS (ABSOLUTE): 0.2 10*3/uL (ref 0.0–0.4)
Eos: 2 %
Hematocrit: 39.5 % (ref 34.0–46.6)
Hemoglobin: 13.2 g/dL (ref 11.1–15.9)
Immature Grans (Abs): 0 10*3/uL (ref 0.0–0.1)
Immature Granulocytes: 0 %
Lymphocytes Absolute: 3.2 10*3/uL — ABNORMAL HIGH (ref 0.7–3.1)
Lymphs: 43 %
MCH: 27.7 pg (ref 26.6–33.0)
MCHC: 33.4 g/dL (ref 31.5–35.7)
MCV: 83 fL (ref 79–97)
Monocytes Absolute: 0.5 10*3/uL (ref 0.1–0.9)
Monocytes: 6 %
Neutrophils Absolute: 3.6 10*3/uL (ref 1.4–7.0)
Neutrophils: 48 %
Platelets: 331 10*3/uL (ref 150–450)
RBC: 4.77 x10E6/uL (ref 3.77–5.28)
RDW: 13.1 % (ref 11.7–15.4)
WBC: 7.4 10*3/uL (ref 3.4–10.8)

## 2018-12-02 LAB — TSH: TSH: 4.78 u[IU]/mL — AB (ref 0.450–4.500)

## 2018-12-02 LAB — LIPID PANEL
Chol/HDL Ratio: 4.7 ratio — ABNORMAL HIGH (ref 0.0–4.4)
Cholesterol, Total: 196 mg/dL (ref 100–199)
HDL: 42 mg/dL (ref >39)
LDL Calculated: 124 mg/dL — ABNORMAL HIGH (ref 0–99)
Triglycerides: 149 mg/dL (ref 0–149)
VLDL Cholesterol Cal: 30 mg/dL (ref 5–40)

## 2018-12-02 LAB — T4, FREE: Free T4: 0.9 ng/dL (ref 0.82–1.77)

## 2018-12-02 LAB — HEMOGLOBIN A1C
Est. average glucose Bld gHb Est-mCnc: 157 mg/dL
HEMOGLOBIN A1C: 7.1 % — AB (ref 4.8–5.6)

## 2018-12-02 LAB — VITAMIN D 25 HYDROXY (VIT D DEFICIENCY, FRACTURES): Vit D, 25-Hydroxy: 11.9 ng/mL — ABNORMAL LOW (ref 30.0–100.0)

## 2018-12-09 ENCOUNTER — Telehealth: Payer: Self-pay | Admitting: Family Medicine

## 2018-12-09 NOTE — Telephone Encounter (Signed)
Called patient back left message to call the office. MPulliam, CMA/RT(R)  

## 2018-12-09 NOTE — Progress Notes (Signed)
Called patient and left message to call the office on home and cell #s. MPulliam, CMA/RT(R)

## 2018-12-09 NOTE — Telephone Encounter (Signed)
Patient states rcvd a message from [facility] to call office.  --Forwarding message to medical assistant to call her at [phone] only.  -glh

## 2018-12-09 NOTE — Telephone Encounter (Signed)
Spoke to patient about OV to discuss labs - appointment made through [facility]. MPulliam, CMA/RT(R)

## 2018-12-09 NOTE — Progress Notes (Signed)
Patient notified and appointment made for the patient. MPulliam, CMA/RT(R)

## 2018-12-11 ENCOUNTER — Ambulatory Visit: Payer: 59 | Admitting: Family Medicine

## 2018-12-11 ENCOUNTER — Encounter: Payer: Self-pay | Admitting: Family Medicine

## 2018-12-11 VITALS — BP 175/79 | HR 72 | Temp 98.6°F | Ht 62.0 in | Wt 189.0 lb

## 2018-12-11 DIAGNOSIS — R7989 Other specified abnormal findings of blood chemistry: Secondary | ICD-10-CM | POA: Diagnosis not present

## 2018-12-11 DIAGNOSIS — E1159 Type 2 diabetes mellitus with other circulatory complications: Secondary | ICD-10-CM | POA: Diagnosis not present

## 2018-12-11 DIAGNOSIS — I152 Hypertension secondary to endocrine disorders: Secondary | ICD-10-CM | POA: Insufficient documentation

## 2018-12-11 DIAGNOSIS — R5382 Chronic fatigue, unspecified: Secondary | ICD-10-CM

## 2018-12-11 DIAGNOSIS — E1169 Type 2 diabetes mellitus with other specified complication: Secondary | ICD-10-CM

## 2018-12-11 DIAGNOSIS — E782 Mixed hyperlipidemia: Secondary | ICD-10-CM

## 2018-12-11 DIAGNOSIS — I10 Essential (primary) hypertension: Secondary | ICD-10-CM

## 2018-12-11 DIAGNOSIS — E559 Vitamin D deficiency, unspecified: Secondary | ICD-10-CM

## 2018-12-11 DIAGNOSIS — E119 Type 2 diabetes mellitus without complications: Secondary | ICD-10-CM | POA: Insufficient documentation

## 2018-12-11 MED ORDER — AMLODIPINE-VALSARTAN-HCTZ 5-160-12.5 MG PO TABS: 1.0000 | ORAL_TABLET | Freq: Every day | ORAL | 3 refills | Status: DC

## 2018-12-11 MED ORDER — LEVOTHYROXINE SODIUM 25 MCG PO TABS: 25.0000 ug | ORAL_TABLET | Freq: Every day | ORAL | 3 refills | Status: DC

## 2018-12-11 MED ORDER — METFORMIN HCL 500 MG PO TABS: 500.0000 mg | ORAL_TABLET | Freq: Two times a day (BID) | ORAL | 3 refills | Status: DC

## 2018-12-11 MED ORDER — BLOOD GLUCOSE METER KIT: PACK | 0 refills | Status: DC

## 2018-12-11 MED ORDER — VITAMIN D (ERGOCALCIFEROL) 1.25 MG (50000 UNIT) PO CAPS: ORAL_CAPSULE | ORAL | 3 refills | Status: DC

## 2018-12-11 MED ORDER — ATORVASTATIN CALCIUM 20 MG PO TABS: 20.0000 mg | ORAL_TABLET | Freq: Every day | ORAL | 3 refills | Status: DC

## 2018-12-11 NOTE — Patient Instructions (Addendum)
Patient needs a photo for her chart.  Beginning Blood Pressure, Cholesterol, & Diabetes Medications Please stagger beginning your medications, to help monitor for side effects.  Begin with half-pill daily of your blood pressure medication. Begin by taking half a dose (half pill) for 14 days. Your goal blood pressure should be, ideally, less than 135/85 on a regular basis. If your blood pressure is not at goal within 1-2 weeks, increase your dose to the whole pill (as written).  After one week of starting your blood pressure medicine, begin your cholesterol medication (Lipitor). Take half a tablet every night before bed.  Do this for one week, and if tolerated well, begin taking full tablet after a week.  Please make sure to drink plenty of water while taking Lipitor, and engage in physical activity (exercise) as often as possible.  Then, one week after starting the Lipitor, add the diabetes medication (Metformin). Begin with a half tablet, twice a day, for one week.  Then increase to a full tablet, twice daily.  Remember, if you have any issues, questions, or concerns with starting medications, the office is available for questions.  Please call!             Diabetes Mellitus and Standards of Medical Care  Managing diabetes (diabetes mellitus) can be complicated. Your diabetes treatment may be managed by a team of health care providers, including:  A diet and nutrition specialist (registered dietitian).  A nurse.  A certified diabetes educator (CDE).  A diabetes specialist (endocrinologist).  An eye doctor.  A primary care provider.  A dentist.  Your health care providers follow a schedule in order to help you get the best quality of care. The following schedule is a general guideline for your diabetes management plan. Your health care providers may also give you more specific instructions.  HbA1c (hemoglobin A1c) test This test provides information about blood sugar  (glucose) control over the previous 2-3 months. It is used to check whether your diabetes management plan needs to be adjusted.  If you are meeting your treatment goals, this test is done at least 2 times a year.  If you are not meeting treatment goals or if your treatment goals have changed, this test is done 4 times a year.  Blood pressure test  This test is done at every routine medical visit. For most people, the goal is less than 130/80. Ask your health care provider what your goal blood pressure should be.  Dental and eye exams  Visit your dentist two times a year.  If you have type 1 diabetes, get an eye exam 3-5 years after you are diagnosed, and then once a year after your first exam. ? If you were diagnosed with type 1 diabetes as a child, get an eye exam when you are age 51 or older and have had diabetes for 3-5 years. After the first exam, you should get an eye exam once a year.  If you have type 2 diabetes, have an eye exam as soon as you are diagnosed, and then once a year after your first exam.  Foot care exam  Visual foot exams are done at every routine medical visit. The exams check for cuts, bruises, redness, blisters, sores, or other problems with the feet.  A complete foot exam is done by your health care provider once a year. This exam includes an inspection of the structure and skin of your feet, and a check of the pulses and sensation in  your feet. ? Type 1 diabetes: Get your first exam 3-5 years after diagnosis. ? Type 2 diabetes: Get your first exam as soon as you are diagnosed.  Check your feet every day for cuts, bruises, redness, blisters, or sores. If you have any of these or other problems that are not healing, contact your health care provider.  Kidney function test (urine microalbumin)  This test is done once a year. ? Type 1 diabetes: Get your first test 5 years after diagnosis. ? Type 2 diabetes: Get your first test as soon as you are  diagnosed._  If you have chronic kidney disease (CKD), get a serum creatinine and estimated glomerular filtration rate (eGFR) test once a year.  Lipid profile (cholesterol, HDL, LDL, triglycerides)  This test should be done when you are diagnosed with diabetes, and every 5 years after the first test. If you are on medicines to lower your cholesterol, you may need to get this test done every year. ? The goal for LDL is less than 100 mg/dL (5.5 mmol/L). If you are at high risk, the goal is less than 70 mg/dL (3.9 mmol/L). ? The goal for HDL is 40 mg/dL (2.2 mmol/L) for men and 50 mg/dL(2.8 mmol/L) for women. An HDL cholesterol of 60 mg/dL (3.3 mmol/L) or higher gives some protection against heart disease. ? The goal for triglycerides is less than 150 mg/dL (8.3 mmol/L).  Immunizations  The yearly flu (influenza) vaccine is recommended for everyone 6 months or older who has diabetes.  The pneumonia (pneumococcal) vaccine is recommended for everyone 2 years or older who has diabetes. If you are 66 or older, you may get the pneumonia vaccine as a series of two separate shots.  The hepatitis B vaccine is recommended for adults shortly after they have been diagnosed with diabetes.  The Tdap (tetanus, diphtheria, and pertussis) vaccine should be given: ? According to normal childhood vaccination schedules, for children. ? Every 10 years, for adults who have diabetes.  The shingles vaccine is recommended for people who have had chicken pox and are 50 years or older.  Mental and emotional health  Screening for symptoms of eating disorders, anxiety, and depression is recommended at the time of diagnosis and afterward as needed. If your screening shows that you have symptoms (you have a positive screening result), you may need further evaluation and be referred to a mental health care provider.  Diabetes self-management education  Education about how to manage your diabetes is recommended at  diagnosis and ongoing as needed.  Treatment plan  Your treatment plan will be reviewed at every medical visit.  Summary  Managing diabetes (diabetes mellitus) can be complicated. Your diabetes treatment may be managed by a team of health care providers.  Your health care providers follow a schedule in order to help you get the best quality of care.  Standards of care including having regular physical exams, blood tests, blood pressure monitoring, immunizations, screening tests, and education about how to manage your diabetes.  Your health care providers may also give you more specific instructions based on your individual health.      Type 2 Diabetes Mellitus, Self Care, Adult Caring for yourself after you have been diagnosed with type 2 diabetes (type 2 diabetes mellitus) means keeping your blood sugar (glucose) under control with a balance of:  Nutrition.  Exercise.  Lifestyle changes.  Medicines or insulin, if necessary.  Support from your team of health care providers and others.  The following  information explains what you need to know to manage your diabetes at home. What do I need to do to manage my blood glucose?  Check your blood glucose every day, as often as told by your health care provider.  Contact your health care provider if your blood glucose is above your target for 2 tests in a row.  Have your A1c (hemoglobin A1c) level checked at least two times a year, or as often as told by your health care provider. Your health care provider will set individualized treatment goals for you. Generally, the goal of treatment is to maintain the following blood glucose levels:  Before meals (preprandial): 80-130 mg/dL (4.4-7.2 mmol/L).  After meals (postprandial): below 180 mg/dL (10 mmol/L).  A1c level: less than 7%.  What do I need to know about hyperglycemia and hypoglycemia? What is hyperglycemia? Hyperglycemia, also called high blood glucose, occurs when blood  glucose is too high.Make sure you know the early signs of hyperglycemia, such as:  Increased thirst.  Hunger.  Feeling very tired.  Needing to urinate more often than usual.  Blurry vision.  What is hypoglycemia? Hypoglycemia, also called low blood glucose, occurswith a blood glucose level at or below 70 mg/dL (3.9 mmol/L). The risk for hypoglycemia increases during or after exercise, during sleep, during illness, and when skipping meals or not eating for a long time (fasting). It is important to know the symptoms of hypoglycemia and treat it right away. Always have a 15-gram rapid-acting carbohydrate snack with you to treat low blood glucose. Family members and close friends should also know the symptoms and should understand how to treat hypoglycemia, in case you are not able to treat yourself. What are the symptoms of hypoglycemia? Hypoglycemia symptoms can include:  Hunger.  Anxiety.  Sweating and feeling clammy.  Confusion.  Dizziness or feeling light-headed.  Sleepiness.  Nausea.  Increased heart rate.  Headache.  Blurry vision.  Seizure.  Nightmares.  Tingling or numbness around the mouth, lips, or tongue.  A change in speech.  Decreased ability to concentrate.  A change in coordination.  Restless sleep.  Tremors or shakes.  Fainting.  Irritability.  How do I treat hypoglycemia?  If you are alert and able to swallow safely, follow the 15:15 rule:  Take 15 grams of a rapid-acting carbohydrate. Rapid-acting options include: ? 1 tube of glucose gel. ? 3 glucose pills. ? 6-8 pieces of hard candy. ? 4 oz (120 mL) of fruit juice. ? 4 oz (120 mL) of regular (not diet) soda.  Check your blood glucose 15 minutes after you take the carbohydrate.  If the repeat blood glucose level is still at or below 70 mg/dL (3.9 mmol/L), take 15 grams of a carbohydrate again.  If your blood glucose level does not increase above 70 mg/dL (3.9 mmol/L) after 3  tries, seek emergency medical care.  After your blood glucose level returns to normal, eat a meal or a snack within 1 hour.  How do I treat severe hypoglycemia? Severe hypoglycemia is when your blood glucose level is at or below 54 mg/dL (3 mmol/L). Severe hypoglycemia is an emergency. Do not wait to see if the symptoms will go away. Get medical help right away. Call your local emergency services (911 in the [place] Do not drive yourself to the hospital. If you have severe hypoglycemia and you cannot eat or drink, you may need an injection of glucagon. A family member or close friend should learn how to check your blood  glucose and how to give you a glucagon injection. Ask your health care provider if you need to have an emergency glucagon injection kit available. Severe hypoglycemia may need to be treated in a hospital. The treatment may include getting glucose through an IV tube. You may also need treatment for the cause of your hypoglycemia. Can having diabetes put me at risk for other conditions? Having diabetes can put you at risk for other long-term (chronic) conditions, such as heart disease and kidney disease. Your health care provider may prescribe medicines to help prevent complications from diabetes. These medicines may include:  Aspirin.  Medicine to lower cholesterol.  Medicine to control blood pressure.  What else can I do to manage my diabetes? Take your diabetes medicines as told  If your health care provider prescribed insulin or diabetes medicines, take them every day.  Do not run out of insulin or other diabetes medicines that you take. Plan ahead so you always have these available.  If you use insulin, adjust your dosage based on how physically active you are and what foods you eat. Your health care provider will tell you how to adjust your dosage. Make healthy food choices  The things that you eat and drink affect your blood glucose and your insulin dosage. Making good  choices helps to control your diabetes and prevent other health problems. A healthy meal plan includes eating lean proteins, complex carbohydrates, fresh fruits and vegetables, low-fat dairy products, and healthy fats. Make an appointment to see a diet and nutrition specialist (registered dietitian) to help you create an eating plan that is right for you. Make sure that you:  Follow instructions from your health care provider about eating or drinking restrictions.  Drink enough fluid to keep your urine clear or pale yellow.  Eat healthy snacks between nutritious meals.  Track the carbohydrates that you eat. Do this by reading food labels and learning the standard serving sizes of foods.  Follow your sick day plan whenever you cannot eat or drink as usual. Make this plan in advance with your health care provider.  Stay active  Exercise regularly, as told by your health care provider. This may include:  Stretching and doing strength exercises, such as yoga or weightlifting, at least 2 times a week.  Doing at least 150 minutes of moderate-intensity or vigorous-intensity exercise each week. This could be brisk walking, biking, or water aerobics. ? Spread out your activity over at least 3 days of the week. ? Do not go more than 2 days in a row without doing some kind of physical activity.  When you start a new exercise or activity, work with your health care provider to adjust your insulin, medicines, or food intake as needed. Make healthy lifestyle choices  Do not use any tobacco products, such as cigarettes, chewing tobacco, and e-cigarettes. If you need help quitting, ask your health care provider.  If your health care provider says that alcohol is safe for you, limit alcohol intake to no more than 1 drink per day for nonpregnant women and 2 drinks per day for men. One drink equals 12 oz of beer, 5 oz of wine, or 1 oz of hard liquor.  Learn to manage stress. If you need help with this,  ask your health care provider. Care for your body   Keep your immunizations up to date. In addition to getting vaccinations as told by your health care provider, it is recommended that you get vaccinated against the following  illnesses: ? The flu (influenza). Get a flu shot every year. ? Pneumonia. ? Hepatitis B.  Schedule an eye exam soon after your diagnosis, and then one time every year after that.  Check your skin and feet every day for cuts, bruises, redness, blisters, or sores. Schedule a foot exam with your health care provider once every year.  Brush your teeth and gums two times a day, and floss at least one time a day. Visit your dentist at least once every 6 months.  Maintain a healthy weight. General instructions  Take over-the-counter and prescription medicines only as told by your health care provider.  Share your diabetes management plan with people in your workplace, school, and household.  Check your urine for ketones when you are ill and as told by your health care provider.  Ask your health care provider: ? Do I need to meet with a diabetes educator? ? Where can I find a support group for people with diabetes?  Carry a medical alert card or wear medical alert jewelry.  Keep all follow-up visits as told by your health care provider. This is important. Where to find more information: For more information about diabetes, visit:  American Diabetes Association (ADA): www.diabetes.org  American Association of Diabetes Educators (AADE): www.diabeteseducator.org/patient-resources  This information is not intended to replace advice given to you by your health care provider. Make sure you discuss any questions you have with your health care provider. Document Released: 02/26/2016 Document Revised: 04/11/2016 Document Reviewed: 12/08/2015 Elsevier Interactive Patient Education  2017 [place].      Blood Glucose Monitoring, Adult Monitoring your blood sugar  (glucose) helps you manage your diabetes. It also helps you and your health care provider determine how well your diabetes management plan is working. Blood glucose monitoring involves checking your blood glucose as often as directed, and keeping a record (log) of your results over time. Why should I monitor my blood glucose? Checking your blood glucose regularly can:  Help you understand how food, exercise, illnesses, and medicines affect your blood glucose.  Let you know what your blood glucose is at any time. You can quickly tell if you are having low blood glucose (hypoglycemia) or high blood glucose (hyperglycemia).  Help you and your health care provider adjust your medicines as needed.  When should I check my blood glucose? Follow instructions from your health care provider about how often to check your blood glucose.   This may depend on:  The type of diabetes you have.  How well-controlled your diabetes is.  Medicines you are taking.  If you have type 1 diabetes:  Check your blood glucose at least 2 times a day.  Also check your blood glucose: ? Before every insulin injection. ? Before and after exercise. ? Between meals. ? 2 hours after a meal. ? Occasionally between 2:00 a.m. and 3:00 a.m., as directed. ? Before potentially dangerous tasks, like driving or using heavy machinery. ? At bedtime.  You may need to check your blood glucose more often, up to 6-10 times a day: ? If you use an insulin pump. ? If you need multiple daily injections (MDI). ? If your diabetes is not well-controlled. ? If you are ill. ? If you have a history of severe hypoglycemia. ? If you have a history of not knowing when your blood glucose is getting low (hypoglycemia unawareness).  If you have type 2 diabetes:  If you take insulin or other diabetes medicines, check your blood glucose  at least 2 times a day.  If you are on intensive insulin therapy, check your blood glucose at least 4  times a day. Occasionally, you may also need to check between 2:00 a.m. and 3:00 a.m., as directed.  Also check your blood glucose: ? Before and after exercise. ? Before potentially dangerous tasks, like driving or using heavy machinery.  You may need to check your blood glucose more often if: ? Your medicine is being adjusted. ? Your diabetes is not well-controlled. ? You are ill.  What is a blood glucose log?  A blood glucose log is a record of your blood glucose readings. It helps you and your health care provider: ? Look for patterns in your blood glucose over time. ? Adjust your diabetes management plan as needed.  Every time you check your blood glucose, write down your result and notes about things that may be affecting your blood glucose, such as your diet and exercise for the day.  Most glucose meters store a record of glucose readings in the meter. Some meters allow you to download your records to a computer. How do I check my blood glucose? Follow these steps to get accurate readings of your blood glucose: Supplies needed   Blood glucose meter.  Test strips for your meter. Each meter has its own strips. You must use the strips that come with your meter.  A needle to prick your finger (lancet). Do not use lancets more than once.  A device that holds the lancet (lancing device).  A journal or log book to write down your results.  Procedure  Wash your hands with soap and water.  Prick the side of your finger (not the tip) with the lancet. Use a different finger each time.  Gently rub the finger until a small drop of blood appears.  Follow instructions that come with your meter for inserting the test strip, applying blood to the strip, and using your blood glucose meter.  Write down your result and any notes.  Alternative testing sites  Some meters allow you to use areas of your body other than your finger (alternative sites) to test your blood.  If you think  you may have hypoglycemia, or if you have hypoglycemia unawareness, do not use alternative sites. Use your finger instead.  Alternative sites may not be as accurate as the fingers, because blood flow is slower in these areas. This means that the result you get may be delayed, and it may be different from the result that you would get from your finger.  The most common alternative sites are: ? Forearm. ? Thigh. ? Palm of the hand.  Additional tips  Always keep your supplies with you.  If you have questions or need help, all blood glucose meters have a 24-hour hotline number that you can call. You may also contact your health care provider.  After you use a few boxes of test strips, adjust (calibrate) your blood glucose meter by following instructions that came with your meter.    The American Diabetes Association suggests the following targets for most nonpregnant adults with diabetes.  More or less stringent glycemic goals may be appropriate for each individual.  A1C: Less than 7% A1C may also be reported as eAG: Less than 154 mg/dl Before a meal (preprandial plasma glucose): 80-130 mg/dl 1-2 hours after beginning of the meal (Postprandial plasma glucose)*: Less than 180 mg/dl  *Postprandial glucose may be targeted if A1C goals are not met despite  reaching preprandial glucose goals.   GOALS in short:  The goals are for the Hgb A1C to be less than 7.0 & blood pressure to be less than 130/80.    It is recommended that all diabetics are educated on and follow a healthy diabetic diet, exercise for 30 minutes 3-4 times per week (walking, biking, swimming, or machine), monitor blood glucose readings and bring that record with you to be reviewed at your next office visit.     You should be checking fasting blood sugars- especially after you eat poorly or eat really healthy, and also check 2 hour postprandial blood sugars after largest meal of the day.    Write these down and bring in  your log at each office visit.    You will need to be seen every 3 months by the provider managing your Diabetes unless told otherwise by that provider.   You will need yearly eye exams from an eye specialist and foot exams to check the nerves of your feet.  Also, your urine should be checked yearly as well to make sure excess protein is not present.   If you are checking your blood pressure at home, please record it and bring it to your next office visit.    Follow the Dietary Approaches to Stop Hypertension (DASH) diet (3 servings of fruit and vegetables daily, whole grains, low sodium, low-fat proteins).  See below.    Lastly, when it comes to your cholesterol, the goal is to have the HDL (good cholesterol) >40, and the LDL (bad cholesterol) <100.   It is recommended that you follow a heart healthy, low saturated and trans-fat diet and exercise for 30 minutes at least 5 times a week.     (( Check out the DASH diet = 1.5 Gram Low Sodium Diet   A 1.5 gram sodium diet restricts the amount of sodium in the diet to no more than 1.5 g or 1500 mg daily.  The American Heart Association recommends Americans over the age of 52 to consume no more than 1500 mg of sodium each day to reduce the risk of developing high blood pressure.  Research also shows that limiting sodium may reduce heart attack and stroke risk.  Many foods contain sodium for flavor and sometimes as a preservative.  When the amount of sodium in a diet needs to be low, it is important to know what to look for when choosing foods and drinks.  The following includes some information and guidelines to help make it easier for you to adapt to a low sodium diet.    QUICK TIPS  Do not add salt to food.  Avoid convenience items and fast food.  Choose unsalted snack foods.  Buy lower sodium products, often labeled as "lower sodium" or "no salt added."  Check food labels to learn how much sodium is in 1 serving.  When eating at a restaurant, ask  that your food be prepared with less salt or none, if possible.    READING FOOD LABELS FOR SODIUM INFORMATION  The nutrition facts label is a good place to find how much sodium is in foods. Look for products with no more than 400 mg of sodium per serving.  Remember that 1.5 g = 1500 mg.  The food label may also list foods as:  Sodium-free: Less than 5 mg in a serving.  Very low sodium: 35 mg or less in a serving.  Low-sodium: 140 mg or less in a serving.  Light in sodium: 50% less sodium in a serving. For example, if a food that usually has 300 mg of sodium is changed to become light in sodium, it will have 150 mg of sodium.  Reduced sodium: 25% less sodium in a serving. For example, if a food that usually has 400 mg of sodium is changed to reduced sodium, it will have 300 mg of sodium.    CHOOSING FOODS  Grains  Avoid: Salted crackers and snack items. Some cereals, including instant hot cereals. Bread stuffing and biscuit mixes. Seasoned rice or pasta mixes.  Choose: Unsalted snack items. Low-sodium cereals, oats, puffed wheat and rice, shredded wheat. English muffins and bread. Pasta.  Meats  Avoid: Salted, canned, smoked, spiced, pickled meats, including fish and poultry. Bacon, ham, sausage, cold cuts, hot dogs, anchovies.  Choose: Low-sodium canned tuna and salmon. Fresh or frozen meat, poultry, and fish.  Dairy  Avoid: Processed cheese and spreads. Cottage cheese. Buttermilk and condensed milk. Regular cheese.  Choose: Milk. Low-sodium cottage cheese. Yogurt. Sour cream. Low-sodium cheese.  Fruits and Vegetables  Avoid: Regular canned vegetables. Regular canned tomato sauce and paste. Frozen vegetables in sauces. Olives. Angie Fava. Relishes. Sauerkraut.  Choose: Low-sodium canned vegetables. Low-sodium tomato sauce and paste. Frozen or fresh vegetables. Fresh and frozen fruit.  Condiments  Avoid: Canned and packaged gravies. Worcestershire sauce. Tartar sauce. Barbecue sauce. Soy  sauce. Steak sauce. Ketchup. Onion, garlic, and table salt. Meat flavorings and tenderizers.  Choose: Fresh and dried herbs and spices. Low-sodium varieties of mustard and ketchup. Lemon juice. Tabasco sauce. Horseradish.    SAMPLE 1.5 GRAM SODIUM MEAL PLAN:   Breakfast / Sodium (mg)  1 cup low-fat milk / 143 mg  1 whole-wheat English muffin / 240 mg  1 tbs heart-healthy margarine / 153 mg  1 hard-boiled egg / 139 mg  1 small orange / 0 mg  Lunch / Sodium (mg)  1 cup raw carrots / 76 mg  2 tbs no salt added peanut butter / 5 mg  2 slices whole-wheat bread / 270 mg  1 tbs jelly / 6 mg   cup red grapes / 2 mg  Dinner / Sodium (mg)  1 cup whole-wheat pasta / 2 mg  1 cup low-sodium tomato sauce / 73 mg  3 oz lean ground beef / 57 mg  1 small side salad (1 cup raw spinach leaves,  cup cucumber,  cup yellow bell pepper) with 1 tsp olive oil and 1 tsp red wine vinegar / 25 mg  Snack / Sodium (mg)  1 container low-fat vanilla yogurt / 107 mg  3 graham cracker squares / 127 mg  Nutrient Analysis  Calories: 1745  Protein: 75 g  Carbohydrate: 237 g  Fat: 57 g  Sodium: 1425 mg  Document Released: 11/04/2005 Document Revised: 07/17/2011 Document Reviewed: 02/05/2010  ExitCare Patient Information 2012 [place].))    This information is not intended to replace advice given to you by your health care provider. Make sure you discuss any questions you have with your health care provider. Document Released: 11/07/2003 Document Revised: 05/24/2016 Document Reviewed: 04/15/2016 Elsevier Interactive Patient Education  2017 [place] for a Low Cholesterol, Low Saturated Fat Diet   Fats - Limit total intake of fats and oils. - Avoid butter, stick margarine, shortening, lard, palm and coconut oils. - Limit mayonnaise, salad dressings, gravies and sauces, unless they are homemade with low-fat ingredients. - Limit chocolate. - Choose low-fat and nonfat products,  such as low-fat mayonnaise, low-fat or non-hydrogenated peanut butter, low-fat or fat-free salad dressings and nonfat gravy. - Use vegetable oil, such as canola or olive oil. - Look for margarine that does not contain trans fatty acids. - Use nuts in moderate amounts. - Read ingredient labels carefully to determine both amount and type of fat present in foods. Limit saturated and trans fats! - Avoid high-fat processed and convenience foods.  Meats and Meat Alternatives - Choose fish, chicken, [place] and lean meats. - Use dried beans, peas, lentils and tofu. - Limit egg yolks to three to four per week. - If you eat red meat, limit to no more than three servings per week and choose loin or round cuts. - Avoid fatty meats, such as bacon, sausage, franks, luncheon meats and ribs. - Avoid all organ meats, including liver.  Dairy - Choose nonfat or low-fat milk, yogurt and cottage cheese. - Most cheeses are high in fat. Choose cheeses made from non-fat milk, such as mozzarella and ricotta cheese. - Choose light or fat-free cream cheese and sour cream. - Avoid cream and sauces made with cream.  Fruits and Vegetables - Eat a wide variety of fruits and vegetables. - Use lemon juice, vinegar or "mist" olive oil on vegetables. - Avoid adding sauces, fat or oil to vegetables.  Breads, Cereals and Grains - Choose whole-grain breads, cereals, pastas and rice. - Avoid high-fat snack foods, such as granola, cookies, pies, pastries, doughnuts and croissants.  Cooking Tips - Avoid deep fried foods. - Trim visible fat off meats and remove skin from poultry before cooking. - Bake, broil, boil, poach or roast poultry, fish and lean meats. - Drain and discard fat that drains out of meat as you cook it. - Add little or no fat to foods. - Use vegetable oil sprays to grease pans for cooking or baking. - Steam vegetables. - Use herbs or no-oil marinades to flavor foods.

## 2018-12-11 NOTE — Progress Notes (Signed)
Assessment and plan:  1. Type 2 diabetes mellitus with other specified complication, without long-term current use of insulin ([name])-    NEW [place]   2. Hypertension associated with diabetes ([name])   3. Mixed diabetic hyperlipidemia associated with type 2 diabetes mellitus ([name])   4. Elevated TSH-  with significant fatigue and symptoms   5. BMI greater than 30 with diabetes   6. Vitamin D deficiency   7. Chronic fatigue      1. Reviewed recent lab work (12/01/2018) in depth with patient today.  All lab work within normal limits unless otherwise noted.  Extensive education was provided.  All questions answered.  - Discussed importance of hydration and physical activity with preservation of organ health.  - Advised patient to stagger starting her medications and wean up to full dose.  - As several new diagnoses and lifestyle changes were discussed, extensive counseling was provided today.  2. Vitamin D Deficiency - 11.9 - Reviewed goal as 50-60 range. - Begin supplementation today.  See med list today. - 50k IU's on Wednesday and Sunday. - Will continue to monitor.  3. Subclinical Hypothyroidism - TSH Elevated, 4.780 - Free T4 is WNL. - As patient states she experiences sx of chronic fatigue, begin synthroid today.  See med list. - Will continue to monitor. - Re-check labs in 6-8 weeks.  4. Diabetes, New Diagnosis - Recent HbA1c measurement of 7.1. - Educated patient about diabetic diagnosis today. - Reviewed goal A1c of less than 7.0.  - Need for medication discussed and reviewed today. - Begin medication today; metformin.  See med list. - Will continue to monitor.  - Need for glucometer today.  - Counseled patient on pathophysiology of disease and discussed various treatment options, which often includes dietary and lifestyle modifications as first line.    - Educated patient on glycemic index of foods.   Importance of prudent low-carb/ketogenic diet discussed with patient in addition to regular exercise and weight loss.  - Check FBS and 2 hours after the biggest meal of your day.  Keep log and bring in next OV for my review.   Also, if you ever feel poorly, please check your blood pressure and blood sugar, as one or the other could be the cause of your symptoms.  - Being a diabetic, you need yearly eye and foot exams. Make appt.for diabetic eye exam.  5. Hyperlipidemia - LDL = 124, elevated. - HDL = 42 - Triglycerides = 149  - Recommended beginning medication today. - Begin management today, Lipitor.  See med list.  The 10-year ASCVD risk score Mikey Bussing DC Brooke Bonito., et al., 2013) is: 6.5%   Values used to calculate the score:     Age: 58 years     Sex: Female     Is Non-Hispanic African American: No     Diabetic: No     Tobacco smoker: No     Systolic Blood Pressure: 165 mmHg     Is BP treated: No     HDL Cholesterol: 42 mg/dL     Total Cholesterol: 196 mg/dL  - Dietary changes such as low saturated & trans fat and low carb/ ketogenic diets discussed with patient.  Encouraged regular exercise and weight loss when appropriate.   - Educational handouts provided at patient's desire.  - Will continue to monitor.  6. Hypertension - Blood pressure elevated on intake today. - Begin medication today.  See med list today. - Patient  understands to wean her dose up from a half pill to full pill slowly.  - Will continue to monitor.  - Prudent lifestyle changes such as dash diet and engaging in a regular exercise program discussed with patient.  Educational handouts provided.  - Ambulatory BP monitoring encouraged.  Keep log and bring in next OV.  7. BMI Counseling - Body mass index is 34.57 kg/m Explained to patient what BMI refers to, and what it means medically.    Told patient to think about it as a "medical risk stratification measurement" and how increasing BMI is associated with  increasing risk/ or worsening state of various diseases such as hypertension, hyperlipidemia, diabetes, premature OA, depression etc.  American Heart Association guidelines for healthy diet, basically Mediterranean diet, and exercise guidelines of 30 minutes 5 days per week or more discussed in detail.  Health counseling performed.  All questions answered.  8. Lifestyle & Preventative Health Maintenance - Advised patient to continue working toward exercising to improve overall mental, physical, and emotional health.    - Reviewed the "spokes of the wheel" of mood and health management.  Stressed the importance of ongoing prudent habits, including regular exercise, appropriate sleep hygiene, healthful dietary habits, and prayer/meditation to relax.  - Encouraged patient to engage in daily physical activity, especially a formal exercise routine.  Recommended that the patient eventually strive for at least 150 minutes of moderate cardiovascular activity per week according to guidelines established by the [facility].   - Healthy dietary habits encouraged, including low-carb, and high amounts of lean protein in diet.   - Patient should also consume adequate amounts of water.    Education and routine counseling performed. Handouts provided.  Pt was interviewed and evaluated by me in the clinic today for 32.5+ minutes, with over 50% time spent in face to face counseling of patients various medical conditions, treatment plans of those medical conditions including medicine management and lifestyle modification, strategies to improve health and well being; and in coordination of care. SEE ABOVE TREATMENT PLAN FOR DETAILS     Orders Placed This Encounter  Procedures  . Comprehensive metabolic panel  . TSH  . T4, free  . T3, free    Meds ordered this encounter  Medications  . amLODIPine-Valsartan-HCTZ 5-160-12.5 MG TABS    Sig: Take 1 tablet by mouth daily.    Dispense:  90 tablet    Refill:  3    . metFORMIN (GLUCOPHAGE) 500 MG tablet    Sig: Take 1 tablet (500 mg total) by mouth 2 (two) times daily with a meal.    Dispense:  180 tablet    Refill:  3  . atorvastatin (LIPITOR) 20 MG tablet    Sig: Take 1 tablet (20 mg total) by mouth at bedtime.    Dispense:  90 tablet    Refill:  3  . Vitamin D, Ergocalciferol, (DRISDOL) 1.25 MG (50000 UT) CAPS capsule    Sig: Every Wed and Sun    Dispense:  24 capsule    Refill:  3  . levothyroxine (SYNTHROID, LEVOTHROID) 25 MCG tablet    Sig: Take 1 tablet (25 mcg total) by mouth daily.    Dispense:  90 tablet    Refill:  3  . blood glucose meter kit and supplies    Sig: Dispense based on patient and insurance preference. Use to check glucose levels fasting in the AM and 2 hours after largest meal. (FOR ICD-10 E10.9, E11.9).    Dispense:  1 each    Refill:  0    Order Specific Question:   Number of strips    Answer:   100    Order Specific Question:   Number of lancets    Answer:   100    Return for 2)  CMP, thyroid labs 3 days prior to 6-week follow-up OV.   Anticipatory guidance and routine counseling done re: condition, txmnt options and need for follow up. All questions of patient's were answered.   Gross side effects, risk and benefits, and alternatives of medications discussed with patient.  Patient is aware that all medications have potential side effects and we are unable to predict every sideeffect or drug-drug interaction that may occur.  Expresses verbal understanding and consents to current therapy plan and treatment regiment.  Please see AVS handed out to patient at the end of our visit for additional patient instructions/ counseling done pertaining to today's office visit.  Note:  This document was prepared using Dragon voice recognition software and may include unintentional dictation errors.    This document serves as a record of services personally performed by Mellody Dance, DO. It was created on her behalf by  Toni Amend, a trained medical scribe. The creation of this record is based on the scribe's personal observations and the provider's statements to them.   I have reviewed the above medical documentation for accuracy and completeness and I concur.  Mellody Dance, DO 12/13/2018 9:50 PM      ----------------------------------------------------------------------------------------------------------------------  Subjective:   CC:   Diane Carter is a 58 y.o. female who presents to [facility] today for review and discussion of recent bloodwork that was done in addition to f/up on chronic conditions we are managing for pt.  1. All recent blood work that we ordered was reviewed with patient today.  Patient was counseled on all abnormalities and we discussed dietary and lifestyle changes that could help those values (also medications when appropriate).  Extensive health counseling performed and all patient's concerns/ questions were addressed.  See labs below and also plan for more details of these abnormalities  Patient states that her blood pressure has been high at home.  States it's been in the 160's, up to 172, however, she has only been taking her blood pressure for about 3-4 days.  Thinks she may have been on blood pressure medication in the past, but she didn't take it.  Back then "I think it was just barely borderline, but not like it is now."  Has not been exercising due to weather and work.  Has a desire to begin exercising at home.  Notes she has cut down her salt intake.    Wt Readings from Last 3 Encounters:  12/11/18 189 lb (85.7 kg)  12/01/18 186 lb 9.6 oz (84.6 kg)  10/02/18 189 lb 8 oz (86 kg)   BP Readings from Last 3 Encounters:  12/11/18 (!) 175/79  12/01/18 (!) 148/84  10/02/18 (!) 148/88   Pulse Readings from Last 3 Encounters:  12/11/18 72  12/01/18 71  10/02/18 70   BMI Readings from Last 3 Encounters:  12/11/18 34.57  kg/m  12/01/18 32.28 kg/m  10/02/18 33.04 kg/m     Patient Care Team    Relationship Specialty Notifications Start End  Mellody Dance, DO PCP - General Family Medicine  10/02/18   Everlene Farrier, MD Consulting Physician Obstetrics and Gynecology  10/02/18   Gastroenterology, [facility]    12/02/18  [facility], [facility]    12/02/18     Full medical history updated and reviewed in the office today  Patient Active Problem List   Diagnosis Date Noted  . Diabetes mellitus ([place]) 12/11/2018  . Hypertension associated with diabetes ([place]) 12/11/2018  . Mixed diabetic hyperlipidemia associated with type 2 diabetes mellitus ([place]) 12/11/2018  . Elevated TSH-  with significant fatigue and symptoms 12/11/2018  . Vitamin D deficiency 12/11/2018  . Chronic fatigue 12/11/2018  . Obesity, Class I, BMI 30-34.9 10/02/2018  . Family history of diabetes mellitus (DM)- aunt 10/02/2018  . Family history of early death-  mother age 108 with history of RA 10/02/2018  . Family history of rheumatoid arthritis-mom 10/02/2018  . FHx: early coronary artery disease 10/02/2018  . Family history of primary liver cancer 10/02/2018  . Elevated systolic blood pressure reading without diagnosis of hypertension 10/02/2018  . Salt craving/  eats a lot of salt 10/02/2018    History reviewed. No pertinent past medical history.  Past Surgical History:  Procedure Laterality Date  . CESAREAN SECTION  1995  . LIVER RESECTION  1992    Social History   Tobacco Use  . Smoking status: Never Smoker  . Smokeless tobacco: Never Used  Substance Use Topics  . Alcohol use: Never    Frequency: Never    Family Hx: Family History  Problem Relation Age of Onset  . Liver cancer Father   . Alcohol abuse Father   . Diabetes type I Paternal Aunt   . Heart attack Other      Medications: Current Outpatient Medications  Medication Sig Dispense Refill  . amLODIPine-Valsartan-HCTZ 5-160-12.5 MG TABS Take 1 tablet by  mouth daily. 90 tablet 3  . atorvastatin (LIPITOR) 20 MG tablet Take 1 tablet (20 mg total) by mouth at bedtime. 90 tablet 3  . blood glucose meter kit and supplies Dispense based on patient and insurance preference. Use to check glucose levels fasting in the AM and 2 hours after largest meal. (FOR ICD-10 E10.9, E11.9). 1 each 0  . levothyroxine (SYNTHROID, LEVOTHROID) 25 MCG tablet Take 1 tablet (25 mcg total) by mouth daily. 90 tablet 3  . metFORMIN (GLUCOPHAGE) 500 MG tablet Take 1 tablet (500 mg total) by mouth 2 (two) times daily with a meal. 180 tablet 3  . Vitamin D, Ergocalciferol, (DRISDOL) 1.25 MG (50000 UT) CAPS capsule Every Wed and Sun 24 capsule 3   No current facility-administered medications for this visit.     Allergies:  No Known Allergies   Review of Systems: General:   No F/C, wt loss Pulm:   No DIB, SOB, pleuritic chest pain Card:  No CP, palpitations Abd:  No n/v/d or pain Ext:  No inc edema from baseline  Objective:  Blood pressure (!) 175/79, pulse 72, temperature 98.6 F (37 C), height _0  (1.575 m), weight 189 lb (85.7 kg), SpO2 99 %. Body mass index is 34.57 kg/m. Gen:   Well NAD, A and O *3 HEENT:    [place]/AT, EOMI,  MMM Lungs:   Normal work of breathing. CTA B/L, no Wh, rhonchi Heart:   RRR, S1, S2 WNL's, no MRG Abd:   No gross distention Exts:    warm, pink,  Brisk capillary refill, warm and well perfused.  Psych:    No HI/SI, judgement and insight good, Euthymic mood. Full Affect.   Recent Results (from the past 2160 hour(s))  CBC with Differential/Platelet     Status: Abnormal  Collection Time: 12/01/18  1:56 PM  Result Value Ref Range   WBC 7.4 3.4 - 10.8 x10E3/uL   RBC 4.77 3.77 - 5.28 x10E6/uL   Hemoglobin 13.2 11.1 - 15.9 g/dL   Hematocrit 39.5 34.0 - 46.6 %   MCV 83 79 - 97 fL   MCH 27.7 26.6 - 33.0 pg   MCHC 33.4 31.5 - 35.7 g/dL   RDW 13.1 11.7 - 15.4 %    Comment:               **Please note reference interval change**    Platelets 331 150 - 450 x10E3/uL   Neutrophils 48 Not Estab. %   Lymphs 43 Not Estab. %   Monocytes 6 Not Estab. %   Eos 2 Not Estab. %   Basos 1 Not Estab. %   Neutrophils Absolute 3.6 1.4 - 7.0 x10E3/uL   Lymphocytes Absolute 3.2 (H) 0.7 - 3.1 x10E3/uL   Monocytes Absolute 0.5 0.1 - 0.9 x10E3/uL   EOS (ABSOLUTE) 0.2 0.0 - 0.4 x10E3/uL   Basophils Absolute 0.1 0.0 - 0.2 x10E3/uL   Immature Granulocytes 0 Not Estab. %   Immature Grans (Abs) 0.0 0.0 - 0.1 x10E3/uL  Comprehensive metabolic panel     Status: None   Collection Time: 12/01/18  1:56 PM  Result Value Ref Range   Glucose 95 65 - 99 mg/dL   BUN 11 6 - 24 mg/dL   Creatinine, Ser 0.72 0.57 - 1.00 mg/dL   GFR calc non Af Amer 92 >59 mL/min/1.73   GFR calc Af Amer 106 >59 mL/min/1.73   BUN/Creatinine Ratio 15 9 - 23   Sodium 138 134 - 144 mmol/L   Potassium 4.4 3.5 - 5.2 mmol/L   Chloride 98 96 - 106 mmol/L   CO2 24 20 - 29 mmol/L   Calcium 9.7 8.7 - 10.2 mg/dL   Total Protein 7.5 6.0 - 8.5 g/dL   Albumin 4.6 3.5 - 5.5 g/dL    Comment:     **Effective December 07, 2018 Albumin reference**       interval will be changing to:              Age                Female          Female           0 -  7 days        3.6 - 4.9      3.6 - 4.9           8 - 30 days        3.4 - 4.7      3.4 - 4.7           1 -  6 month       3.7 - 4.8      3.7 - 4.8    7 months -  2 years       3.9 - 5.0      3.9 - 5.0           3 -  5 years       4.0 - 5.0      4.0 - 5.0           6 - 12 years       4.1 - 5.0      4.0 - 5.0          13 -  30 years       4.1 - 5.2      3.9 - 5.0          31 - 50 years       4.0 - 5.0      3.8 - 4.8          51 - 60 years       3.8 - 4.9      3.8 - 4.9          61 - 70 years       3.8 - 4.8      3.8 - 4.8          71 - 80 years       3.7 - 4.7      3.7 - 4.7          81 - 89 years       3.6 - 4.6      3.6 - 4.6              >89 years       3.5 - 4.6      3.5 - 4.6    Globulin, Total 2.9 1.5 - 4.5 g/dL    Albumin/Globulin Ratio 1.6 1.2 - 2.2   Bilirubin Total 0.7 0.0 - 1.2 mg/dL   Alkaline Phosphatase 104 39 - 117 IU/L   AST 27 0 - 40 IU/L   ALT 24 0 - 32 IU/L  Hemoglobin A1c     Status: Abnormal   Collection Time: 12/01/18  1:56 PM  Result Value Ref Range   Hgb A1c MFr Bld 7.1 (H) 4.8 - 5.6 %    Comment:          Prediabetes: 5.7 - 6.4          Diabetes: >6.4          Glycemic control for adults with diabetes: <7.0    Est. average glucose Bld gHb Est-mCnc 157 mg/dL  Lipid panel     Status: Abnormal   Collection Time: 12/01/18  1:56 PM  Result Value Ref Range   Cholesterol, Total 196 100 - 199 mg/dL   Triglycerides 149 0 - 149 mg/dL   HDL 42 >39 mg/dL   VLDL Cholesterol Cal 30 5 - 40 mg/dL   LDL Calculated 124 (H) 0 - 99 mg/dL   Chol/HDL Ratio 4.7 (H) 0.0 - 4.4 ratio    Comment:                                   T. Chol/HDL Ratio                                             Men  Women                               1/2 Avg.Risk  3.4    3.3                                   Avg.Risk  5.0    4.4  2X Avg.Risk  9.6    7.1                                3X Avg.Risk 23.4   11.0   T4, free     Status: None   Collection Time: 12/01/18  1:56 PM  Result Value Ref Range   Free T4 0.90 0.82 - 1.77 ng/dL  TSH     Status: Abnormal   Collection Time: 12/01/18  1:56 PM  Result Value Ref Range   TSH 4.780 (H) 0.450 - 4.500 uIU/mL  VITAMIN D 25 Hydroxy (Vit-D Deficiency, Fractures)     Status: Abnormal   Collection Time: 12/01/18  1:56 PM  Result Value Ref Range   Vit D, 25-Hydroxy 11.9 (L) 30.0 - 100.0 ng/mL    Comment: Vitamin D deficiency has been defined by the [place] and an Endocrine Society practice guideline as a level of serum 25-OH vitamin D less than 20 ng/mL (1,2). The Endocrine Society went on to further define vitamin D insufficiency as a level between 21 and 29 ng/mL (2). 1. IOM (Institute of Medicine). 2010. Dietary reference     intakes for calcium and D. [place]: The    [employer]. 2. Holick MF, Binkley Loup, Bischoff-Ferrari HA, et al.    Evaluation, treatment, and prevention of vitamin D    deficiency: an Endocrine Society clinical practice    guideline. JCEM. 2011 Jul; 96(7):1911-30.

## 2018-12-15 ENCOUNTER — Telehealth: Payer: Self-pay | Admitting: Family Medicine

## 2018-12-15 DIAGNOSIS — I10 Essential (primary) hypertension: Principal | ICD-10-CM

## 2018-12-15 DIAGNOSIS — E1159 Type 2 diabetes mellitus with other circulatory complications: Secondary | ICD-10-CM

## 2018-12-15 NOTE — Telephone Encounter (Signed)
Per patient [facility] has advised her that they are waiting on Pre-cert  & have sent request to provider office---forwarding message to medical assistant for review :   amLODIPine-Valsartan-HCTZ 5-160-12.5 MG TABS [[id number]]   Order Details  Dose: 1 tablet Route: Oral Frequency: Daily  Dispense Quantity: 90 tablet Refills: 3 Fills remaining: --        Sig: Take 1 tablet by mouth daily.     pls send to :  [id number] [place], [place] - [address] AT [facility] [phone] (Phone) [phone] (Fax)   --glh

## 2018-12-15 NOTE — Telephone Encounter (Signed)
PA has been sent to insurance - waiting on approval or denial.  Called to notify patient and left message for her to call the office. MPulliam, CMA/RT(R)

## 2018-12-16 ENCOUNTER — Telehealth: Payer: Self-pay | Admitting: Family Medicine

## 2018-12-16 MED ORDER — LOSARTAN POTASSIUM 100 MG PO TABS: 100.0000 mg | ORAL_TABLET | Freq: Every day | ORAL | 0 refills | Status: DC

## 2018-12-16 MED ORDER — HYDROCHLOROTHIAZIDE 12.5 MG PO TABS: 12.5000 mg | ORAL_TABLET | Freq: Every day | ORAL | 0 refills | Status: DC

## 2018-12-16 MED ORDER — AMLODIPINE BESYLATE 5 MG PO TABS: 5.0000 mg | ORAL_TABLET | Freq: Every day | ORAL | 0 refills | Status: DC

## 2018-12-16 NOTE — Telephone Encounter (Signed)
Best thing is for patient to take them all individually:  losartan 100 mg 1 p.o. daily dispense 90 no refill  Additionally, take amlodipine 5 mg 1 p.o. daily dispense 90 no refill  And prescription for hydrochlorothiazide 12.5 mg 1 p.o. daily dispense 90 no refill.

## 2018-12-16 NOTE — Telephone Encounter (Signed)
Sent in medication, left message for the patient to call the office. MPulliam, CMA/RT(R)

## 2018-12-16 NOTE — Telephone Encounter (Signed)
Patient called back after receiving VM from Melissa to contact our office, please contact patient at [phone] when available

## 2018-12-16 NOTE — Telephone Encounter (Signed)
PA through insurance was denied for medications and it is not covered by insurance.  LOV 12/11/2018.  Please review and advise if we can change medications to an alternative. MPulliam, CMA/RT(R)

## 2018-12-17 NOTE — Telephone Encounter (Signed)
Patient notified. MPulliam, CMA/RT(R)  

## 2018-12-31 ENCOUNTER — Ambulatory Visit: Payer: 59 | Admitting: Family Medicine

## 2019-01-20 ENCOUNTER — Other Ambulatory Visit (INDEPENDENT_AMBULATORY_CARE_PROVIDER_SITE_OTHER): Payer: 59

## 2019-01-20 DIAGNOSIS — R7989 Other specified abnormal findings of blood chemistry: Secondary | ICD-10-CM

## 2019-01-20 DIAGNOSIS — R5382 Chronic fatigue, unspecified: Secondary | ICD-10-CM

## 2019-01-20 DIAGNOSIS — E782 Mixed hyperlipidemia: Secondary | ICD-10-CM

## 2019-01-20 DIAGNOSIS — I10 Essential (primary) hypertension: Secondary | ICD-10-CM

## 2019-01-20 DIAGNOSIS — E1169 Type 2 diabetes mellitus with other specified complication: Secondary | ICD-10-CM

## 2019-01-20 DIAGNOSIS — E1159 Type 2 diabetes mellitus with other circulatory complications: Secondary | ICD-10-CM

## 2019-01-21 LAB — COMPREHENSIVE METABOLIC PANEL
ALT: 23 IU/L (ref 0–32)
AST: 24 IU/L (ref 0–40)
Albumin/Globulin Ratio: 1.6 (ref 1.2–2.2)
Albumin: 4.4 g/dL (ref 3.8–4.9)
Alkaline Phosphatase: 98 IU/L (ref 39–117)
BUN/Creatinine Ratio: 11 (ref 9–23)
BUN: 8 mg/dL (ref 6–24)
Bilirubin Total: 0.4 mg/dL (ref 0.0–1.2)
CO2: 23 mmol/L (ref 20–29)
CREATININE: 0.75 mg/dL (ref 0.57–1.00)
Calcium: 9.5 mg/dL (ref 8.7–10.2)
Chloride: 104 mmol/L (ref 96–106)
GFR calc Af Amer: 101 mL/min/{1.73_m2} (ref >59)
GFR calc non Af Amer: 88 mL/min/{1.73_m2} (ref >59)
Globulin, Total: 2.8 g/dL (ref 1.5–4.5)
Glucose: 95 mg/dL (ref 65–99)
Potassium: 4.1 mmol/L (ref 3.5–5.2)
Sodium: 142 mmol/L (ref 134–144)
TOTAL PROTEIN: 7.2 g/dL (ref 6.0–8.5)

## 2019-01-21 LAB — T4, FREE: Free T4: 1.05 ng/dL (ref 0.82–1.77)

## 2019-01-21 LAB — T3, FREE: T3, Free: 2.7 pg/mL (ref 2.0–4.4)

## 2019-01-21 LAB — TSH: TSH: 3.15 u[IU]/mL (ref 0.450–4.500)

## 2019-01-22 ENCOUNTER — Ambulatory Visit: Payer: 59 | Admitting: Family Medicine

## 2019-01-22 ENCOUNTER — Encounter: Payer: Self-pay | Admitting: Family Medicine

## 2019-01-22 VITALS — BP 122/76 | HR 68 | Temp 98.3°F | Ht 62.0 in | Wt 179.0 lb

## 2019-01-22 DIAGNOSIS — E1159 Type 2 diabetes mellitus with other circulatory complications: Secondary | ICD-10-CM

## 2019-01-22 DIAGNOSIS — E782 Mixed hyperlipidemia: Secondary | ICD-10-CM

## 2019-01-22 DIAGNOSIS — R7989 Other specified abnormal findings of blood chemistry: Secondary | ICD-10-CM

## 2019-01-22 DIAGNOSIS — E1169 Type 2 diabetes mellitus with other specified complication: Secondary | ICD-10-CM | POA: Diagnosis not present

## 2019-01-22 DIAGNOSIS — I10 Essential (primary) hypertension: Secondary | ICD-10-CM

## 2019-01-22 DIAGNOSIS — I152 Hypertension secondary to endocrine disorders: Secondary | ICD-10-CM

## 2019-01-22 NOTE — Progress Notes (Signed)
Impression and Recommendations:    1. Type 2 diabetes mellitus with other specified complication, without long-term current use of insulin ([place])   2. Hypertension associated with diabetes ([place])   3. Mixed diabetic hyperlipidemia associated with type 2 diabetes mellitus ([name])   4. OBesity assoc with DM, HTN, Chol, hypothyroidism, vit D def etc   5. Elevated TSH-  with significant fatigue and symptoms     DM:  -Patient is not checking her blood sugar at home, nor does she have a glucometer. -- ---  She also hasn't been taking her metformin. She reports that she cut out sugar since her last visit.  -In depth conversation with the patient today regarding obtaining a glucometer and checking her blood sugar. Discussed healthy eating options with the patient.  -Discussed with the patient that she will need to obtain a glucometer.  -Advised the patient that if her fasting blood sugars are all under 120, then you don't need to start the Metformin. However, if they are above it, then she will start on 0.5 tablet BID.   HTN:  -Blood pressure in the office today at 122/76  -Prior to taking medications, her blood pressure was at 172/84, 157/81, 153/76, after the half a tablet of Norvasc daily on average her blood pressure has been 138-108/78-58, lowest at 101/55. Pt is asymptomatic at this time without dizziness. Her pulses are all in the 60's range.  -Discussed that the patient can check her blood pressure up to 3 times a week or if she feels bad.  -Discussed that if the patient continues to lose more weight, then we will evaluate taking her off of her Norvasc.   Hypothyroidism:  -Labs from 01/20/2019 show TSH, T3, and T4 are all WNL.  -She is taking 0.5 tablet of synthroid.  -Patient advised to continue on this medication.   Vitamin D Deficiency:  -Patient did start on the vitamin D supplement.  -We will recheck these levels on 04/01/2019.   HLD:  -Prior LDL on 12/01/2018 at 124.  -Discussed  that due to her DM, we would like her LDL to be lower than 70.  -She is taking 0.5 tablet of Lipitor. She is not having any issues at this time.  -Creatine at 0.75 as of 01/20/2019.  -Will recheck lipid panel in 4 months.    BMI Counseling:  -Patient was 189 lb on 12/11/2018 and she is now 179 lbs today, 01/22/2019.  -Explained to patient what BMI refers to, and what it means medically.    Told patient to think about it as a "medical risk stratification measurement" and how increasing BMI is associated with increasing risk/ or worsening state of various diseases such as hypertension, hyperlipidemia, diabetes, premature OA, depression etc. -American Heart Association guidelines for healthy diet, basically Mediterranean diet, and exercise guidelines of 30 minutes 5 days per week or more discussed in detail. -Health counseling performed.  All questions answered.   Follow up in 2 months with labs (Vitamin d, A1c, fasting lipid profile) prior to the OV.    Education and routine counseling performed. Handouts provided.   .time   Orders Placed This Encounter  Procedures  . HM Diabetes Foot Exam     Medications Discontinued During This Encounter  Medication Reason  . amLODipine (NORVASC) 5 MG tablet Dose change  . amLODIPine-Valsartan-HCTZ 5-160-12.5 MG TABS Change in therapy  . atorvastatin (LIPITOR) 20 MG tablet Dose change  . hydrochlorothiazide (HYDRODIURIL) 12.5 MG tablet Dose change  .  levothyroxine (SYNTHROID, LEVOTHROID) 25 MCG tablet Dose change  . losartan (COZAAR) 100 MG tablet Dose change  . metFORMIN (GLUCOPHAGE) 500 MG tablet Patient Preference      No orders of the defined types were placed in this encounter.   The patient was counseled, risk factors were discussed, anticipatory guidance given.  Gross side effects, risk and benefits, and alternatives of medications discussed with patient.  Patient is aware that all medications have potential side effects and we are  unable to predict every side effect or drug-drug interaction that may occur.  Expresses verbal understanding and consents to current therapy plan and treatment regimen.   Return for 2) FLP, Vit D, CMP, A1c mid-May, then OV with me 1 wk DM, BP, CHol, Ob, Vit D, HypoTH.   Please see AVS handed out to patient at the end of our visit for further patient instructions/ counseling done pertaining to today's office visit.    Note:  This document was prepared using Dragon voice recognition software and may include unintentional dictation errors.   This document serves as a record of services personally performed by Mellody Dance, DO. It was created on her behalf by Steva Colder, a trained medical scribe. The creation of this record is based on the scribe's personal observations and the provider's statements to them.   I have reviewed the above medical documentation for accuracy and completeness and I concur.  Mellody Dance, DO 01/24/2019 9:09 PM    Subjective:    Chief Complaint  Patient presents with  . Follow-up   Diane Carter is a 58 y.o. female who presents to [place] at [facility] today for Diabetes Management.    Vitamin D deficiency:  She started the vitamin D supplement with no issues since.   HLD:  She is taking 0.5 tablet of Lipitor. She is not having any issues at this time.  -Discussed that due to her DM, we would like her LDL to be around 70 or less if possible   1. HTN HPI:  -  Her blood pressure has been controlled at home.  Pt is checking it at home. Prior to taking medications, her blood pressure was at 172/84, 157/81, 153/76, after the half a tablet of Norvasc on average her blood pressure has been 138-108/78-58, lowest at 101/55. Pt is asymptomatic at this time without dizziness. Her pulses are all in the 60's range. She has been walking since her last visit, however, she hasn't been able to walk as much lately.   - Patient reports good  compliance with blood pressure medications  - Denies medication S-E   - Smoking Status noted   - She denies new onset of: chest pain, exercise intolerance, shortness of breath, dizziness, visual changes, headache, lower extremity swelling or claudication.   Today their BP is 122/76.    Last 3 blood pressure readings in our office are as follows: BP Readings from Last 3 Encounters:  01/22/19 122/76  12/11/18 (!) 175/79  12/01/18 (!) 148/84    Filed Weights   01/22/19 0954  Weight: 179 lb (81.2 kg)     DM HPI: -  She has been working on diet and exercise for diabetes  Pt is currently maintained on the following medications for diabetes:   see med list today Medication compliance - she is not taking metformin because she stopped eating sugar. She is not sure if she will get a glucometer at this time.    Denies polyuria/polydipsia. Denies hypo/  hyperglycemia symptoms - She denies new onset of: chest pain, exercise intolerance, shortness of breath, dizziness, visual changes, headache, lower extremity swelling or claudication.   Last diabetic eye exam was No results found for: [id number]  Foot exam- UTD  Last A1C in the office was:  Lab Results  Component Value Date   HGBA1C 7.1 (H) 12/01/2018    Lab Results  Component Value Date   LDLCALC 124 (H) 12/01/2018   CREATININE 0.75 01/20/2019      Last 3 blood pressure readings in our office are as follows: BP Readings from Last 3 Encounters:  01/22/19 122/76  12/11/18 (!) 175/79  12/01/18 (!) 148/84    BMI Readings from Last 3 Encounters:  01/22/19 32.74 kg/m  12/11/18 34.57 kg/m  12/01/18 32.28 kg/m     No problems updated.    Patient Care Team    Relationship Specialty Notifications Start End  Mellody Dance, DO PCP - General Family Medicine  10/02/18   Everlene Farrier, MD Consulting Physician Obstetrics and Gynecology  10/02/18   Gastroenterology, [facility]    12/02/18   [facility]     12/02/18      Patient Active Problem List   Diagnosis Date Noted  . OBesity assoc with DM, HTN, Chol, hypothyroidism, vit D def etc 01/22/2019    Priority: High  . Diabetes mellitus ([place]) 12/11/2018    Priority: High  . Hypertension associated with diabetes ([place]) 12/11/2018    Priority: High  . Mixed diabetic hyperlipidemia associated with type 2 diabetes mellitus ([place]) 12/11/2018    Priority: High  . FHx: early coronary artery disease 10/02/2018    Priority: High  . Elevated TSH-  with significant fatigue and symptoms 12/11/2018    Priority: Medium  . Vitamin D deficiency 12/11/2018    Priority: Low  . Chronic fatigue 12/11/2018  . Family history of diabetes mellitus (DM)- aunt 10/02/2018  . Family history of early death-  mother age 37 with history of RA 10/02/2018  . Family history of rheumatoid arthritis-mom 10/02/2018  . Family history of primary liver cancer 10/02/2018  . Salt craving/  eats a lot of salt 10/02/2018     No past medical history on file.   Past Surgical History:  Procedure Laterality Date  . CESAREAN SECTION  1995  . LIVER RESECTION  1992     Family History  Problem Relation Age of Onset  . Liver cancer Father   . Alcohol abuse Father   . Diabetes type I Paternal Aunt   . Heart attack Other      Social History   Substance and Sexual Activity  Drug Use Never  ,  Social History   Substance and Sexual Activity  Alcohol Use Never  . Frequency: Never  ,  Social History   Tobacco Use  Smoking Status Never Smoker  Smokeless Tobacco Never Used  ,    Current Outpatient Medications on File Prior to Visit  Medication Sig Dispense Refill  . amLODipine (NORVASC) 5 MG tablet Take 2.5 mg by mouth daily.    Marland Kitchen atorvastatin (LIPITOR) 20 MG tablet Take 10 mg by mouth daily.    . blood glucose meter kit and supplies Dispense based on patient and insurance preference. Use to check glucose levels fasting in the AM and 2 hours after largest meal.  (FOR ICD-10 E10.9, E11.9). 1 each 0  . hydrochlorothiazide (HYDRODIURIL) 12.5 MG tablet Take 6.25 mg by mouth daily.    Marland Kitchen levothyroxine (SYNTHROID, LEVOTHROID) 25  MCG tablet Take 12.5 mcg by mouth daily before breakfast.    . losartan (COZAAR) 100 MG tablet Take 50 mg by mouth daily.    . Vitamin D, Ergocalciferol, (DRISDOL) 1.25 MG (50000 UT) CAPS capsule Every Wed and Sun 24 capsule 3   No current facility-administered medications on file prior to visit.      No Known Allergies   Review of Systems:   General:  Denies fever, chills Optho/Auditory:   Denies visual changes, blurred vision Respiratory:   Denies SOB, cough, wheeze, DIB  Cardiovascular:   Denies chest pain, palpitations, painful respirations Gastrointestinal:   Denies nausea, vomiting, diarrhea.  Endocrine:     Denies new hot or cold intolerance Musculoskeletal:  Denies joint swelling, gait issues, or new unexplained myalgias/ arthralgias Skin:  Denies rash, suspicious lesions  Neurological:    Denies dizziness, unexplained weakness, numbness  Psychiatric/Behavioral:   Denies mood changes    Objective:     Blood pressure 122/76, pulse 68, temperature 98.3 F (36.8 C), height _0  (1.575 m), weight 179 lb (81.2 kg).  Body mass index is 32.74 kg/m.  General: Well Developed, well nourished, and in no acute distress.  HEENT: Normocephalic, atraumatic, pupils equal round reactive to light, neck supple, No carotid bruits, no JVD Skin: Warm and dry, cap RF less 2 sec Cardiac: Regular rate and rhythm, S1, S2 WNL's, no murmurs rubs or gallops Respiratory: ECTA B/L, Not using accessory muscles, speaking in full sentences. NeuroM-Sk: Ambulates w/o assistance, moves ext * 4 w/o difficulty, sensation grossly intact.  Ext: scant edema b/l lower ext Psych: No HI/SI, judgement and insight good, Euthymic mood. Full Affect.

## 2019-01-22 NOTE — Patient Instructions (Addendum)
Regarding your newly diagnosed diabetes:  -Check your blood sugar after you eat (especially bad the night before) also when you eat good the night before. This is so we can have a great spectrum of your "goods" and "bads". If you ever feel bad, then check your blood sugar. Also, make sure to check your blood sugar 2 hours after your largest meal. Make sure that you check your blood sugar at least 2-3 times a week.  -If fasting blood sugars are all under 120, then you don't need to start the Metformin. If the fasting blood sugars are above 120, start 0.5 tablet of Metformin twice daily.    Blood Pressure:  If blood pressures are all running around 100-110/50's, then she can come off her Norvasc.            Your goal blood pressure should be 130/80 or less on a regular basis, or medications should be started/ modified.    Normal blood pressure is less than 120/80.    Hypertension Hypertension, commonly called high blood pressure, is when the force of blood pumping through the arteries is too strong. The arteries are the blood vessels that carry blood from the heart throughout the body. Hypertension forces the heart to work harder to pump blood and may cause arteries to become narrow or stiff. Having untreated or uncontrolled hypertension can cause heart attacks, strokes, kidney disease, and other problems. A blood pressure reading consists of a higher number over a lower number. Ideally, your blood pressure should be below 120/80. The first ("top") number is called the systolic pressure. It is a measure of the pressure in your arteries as your heart beats. The second ("bottom") number is called the diastolic pressure. It is a measure of the pressure in your arteries as the heart relaxes. What are the causes? The cause of this condition is not known. What increases the risk? Some risk factors for high blood pressure are under your control. Others are not. Factors you can  change  Smoking.  Having type 2 diabetes mellitus, high cholesterol, or both.  Not getting enough exercise or physical activity.  Being overweight.  Having too much fat, sugar, calories, or salt (sodium) in your diet.  Drinking too much alcohol. Factors that are difficult or impossible to change  Having chronic kidney disease.  Having a family history of high blood pressure.  Age. Risk increases with age.  Race. You may be at higher risk if you are African-American.  Gender. Men are at higher risk than women before age 64. After age 67, women are at higher risk than men.  Having obstructive sleep apnea.  Stress. What are the signs or symptoms? Extremely high blood pressure (hypertensive crisis) may cause:  Headache.  Anxiety.  Shortness of breath.  Nosebleed.  Nausea and vomiting.  Severe chest pain.  Jerky movements you cannot control (seizures).  How is this diagnosed? This condition is diagnosed by measuring your blood pressure while you are seated, with your arm resting on a surface. The cuff of the blood pressure monitor will be placed directly against the skin of your upper arm at the level of your heart. It should be measured at least twice using the same arm. Certain conditions can cause a difference in blood pressure between your right and left arms. Certain factors can cause blood pressure readings to be lower or higher than normal (elevated) for a short period of time:  When your blood pressure is higher when you are  in a health care provider's office than when you are at home, this is called white coat hypertension. Most people with this condition do not need medicines.  When your blood pressure is higher at home than when you are in a health care provider's office, this is called masked hypertension. Most people with this condition may need medicines to control blood pressure.  If you have a high blood pressure reading during one visit or you have  normal blood pressure with other risk factors:  You may be asked to return on a different day to have your blood pressure checked again.  You may be asked to monitor your blood pressure at home for 1 week or longer.  If you are diagnosed with hypertension, you may have other blood or imaging tests to help your health care provider understand your overall risk for other conditions. How is this treated? This condition is treated by making healthy lifestyle changes, such as eating healthy foods, exercising more, and reducing your alcohol intake. Your health care provider may prescribe medicine if lifestyle changes are not enough to get your blood pressure under control, and if:  Your systolic blood pressure is above 130.  Your diastolic blood pressure is above 80.  Your personal target blood pressure may vary depending on your medical conditions, your age, and other factors. Follow these instructions at home: Eating and drinking  Eat a diet that is high in fiber and potassium, and low in sodium, added sugar, and fat. An example eating plan is called the DASH (Dietary Approaches to Stop Hypertension) diet. To eat this way: ? Eat plenty of fresh fruits and vegetables. Try to fill half of your plate at each meal with fruits and vegetables. ? Eat whole grains, such as whole wheat pasta, brown rice, or whole grain bread. Fill about one quarter of your plate with whole grains. ? Eat or drink low-fat dairy products, such as skim milk or low-fat yogurt. ? Avoid fatty cuts of meat, processed or cured meats, and poultry with skin. Fill about one quarter of your plate with lean proteins, such as fish, chicken without skin, beans, eggs, and tofu. ? Avoid premade and processed foods. These tend to be higher in sodium, added sugar, and fat.  Reduce your daily sodium intake. Most people with hypertension should eat less than 1,500 mg of sodium a day.  Limit alcohol intake to no more than 1 drink a day for  nonpregnant women and 2 drinks a day for men. One drink equals 12 oz of beer, 5 oz of wine, or 1 oz of hard liquor. Lifestyle  Work with your health care provider to maintain a healthy body weight or to lose weight. Ask what an ideal weight is for you.  Get at least 30 minutes of exercise that causes your heart to beat faster (aerobic exercise) most days of the week. Activities may include walking, swimming, or biking.  Include exercise to strengthen your muscles (resistance exercise), such as pilates or lifting weights, as part of your weekly exercise routine. Try to do these types of exercises for 30 minutes at least 3 days a week.  Do not use any products that contain nicotine or tobacco, such as cigarettes and e-cigarettes. If you need help quitting, ask your health care provider.  Monitor your blood pressure at home as told by your health care provider.  Keep all follow-up visits as told by your health care provider. This is important. Medicines  Take over-the-counter and  prescription medicines only as told by your health care provider. Follow directions carefully. Blood pressure medicines must be taken as prescribed.  Do not skip doses of blood pressure medicine. Doing this puts you at risk for problems and can make the medicine less effective.  Ask your health care provider about side effects or reactions to medicines that you should watch for. Contact a health care provider if:  You think you are having a reaction to a medicine you are taking.  You have headaches that keep coming back (recurring).  You feel dizzy.  You have swelling in your ankles.  You have trouble with your vision. Get help right away if:  You develop a severe headache or confusion.  You have unusual weakness or numbness.  You feel faint.  You have severe pain in your chest or abdomen.  You vomit repeatedly.  You have trouble breathing. Summary  Hypertension is when the force of blood pumping  through your arteries is too strong. If this condition is not controlled, it may put you at risk for serious complications.  Your personal target blood pressure may vary depending on your medical conditions, your age, and other factors. For most people, a normal blood pressure is less than 120/80.  Hypertension is treated with lifestyle changes, medicines, or a combination of both. Lifestyle changes include weight loss, eating a healthy, low-sodium diet, exercising more, and limiting alcohol. This information is not intended to replace advice given to you by your health care provider. Make sure you discuss any questions you have with your health care provider. Document Released: 11/04/2005 Document Revised: 10/02/2016 Document Reviewed: 10/02/2016 Elsevier Interactive Patient Education  2018 [employer].    How to Take Your Blood Pressure   Blood pressure is a measurement of how strongly your blood is pressing against the walls of your arteries. Arteries are blood vessels that carry blood from your heart throughout your body. Your health care provider takes your blood pressure at each office visit. You can also take your own blood pressure at home with a blood pressure machine. You may need to take your own blood pressure:  To confirm a diagnosis of high blood pressure (hypertension).  To monitor your blood pressure over time.  To make sure your blood pressure medicine is working.  Supplies needed: To take your blood pressure, you will need a blood pressure machine. You can buy a blood pressure machine, or blood pressure monitor, at most drugstores or online. There are several types of home blood pressure monitors. When choosing one, consider the following:  Choose a monitor that has an arm cuff.  Choose a monitor that wraps snugly around your upper arm. You should be able to fit only one finger between your arm and the cuff.  Do not choose a monitor that measures your blood pressure  from your wrist or finger.  Your health care provider can suggest a reliable monitor that will meet your needs. How to prepare To get the most accurate reading, avoid the following for 30 minutes before you check your blood pressure:  Drinking caffeine.  Drinking alcohol.  Eating.  Smoking.  Exercising.  Five minutes before you check your blood pressure:  Empty your bladder.  Sit quietly without talking in a dining chair, rather than in a soft couch or armchair.  How to take your blood pressure To check your blood pressure, follow the instructions in the manual that came with your blood pressure monitor. If you have a digital  blood pressure monitor, the instructions may be as follows: 1. Sit up straight. 2. Place your feet on the floor. Do not cross your ankles or legs. 3. Rest your left arm at the level of your heart on a table or desk or on the arm of a chair. 4. Pull up your shirt sleeve. 5. Wrap the blood pressure cuff around the upper part of your left arm, 1 inch (2.5 cm) above your elbow. It is best to wrap the cuff around bare skin. 6. Fit the cuff snugly around your arm. You should be able to place only one finger between the cuff and your arm. 7. Position the cord inside the groove of your elbow. 8. Press the power button. 9. Sit quietly while the cuff inflates and deflates. 10. Read the digital reading on the monitor screen and write it down (record it). 11. Wait 2-3 minutes, then repeat the steps, starting at step 1.  What does my blood pressure reading mean? A blood pressure reading consists of a higher number over a lower number. Ideally, your blood pressure should be below 120/80. The first ("top") number is called the systolic pressure. It is a measure of the pressure in your arteries as your heart beats. The second ("bottom") number is called the diastolic pressure. It is a measure of the pressure in your arteries as the heart relaxes. Blood pressure is  classified into four stages. The following are the stages for adults who do not have a short-term serious illness or a chronic condition. Systolic pressure and diastolic pressure are measured in a unit called mm Hg. Normal  Systolic pressure: below 292.  Diastolic pressure: below 80. Elevated  Systolic pressure: 446-286.  Diastolic pressure: below 80. Hypertension stage 1  Systolic pressure: 381-771.  Diastolic pressure: 16-57. Hypertension stage 2  Systolic pressure: 903 or above.  Diastolic pressure: 90 or above. You can have prehypertension or hypertension even if only the systolic or only the diastolic number in your reading is higher than normal. Follow these instructions at home:  Check your blood pressure as often as recommended by your health care provider.  Take your monitor to the next appointment with your health care provider to make sure: ? That you are using it correctly. ? That it provides accurate readings.  Be sure you understand what your goal blood pressure numbers are.  Tell your health care provider if you are having any side effects from blood pressure medicine. Contact a health care provider if:  Your blood pressure is consistently high. Get help right away if:  Your systolic blood pressure is higher than 180.  Your diastolic blood pressure is higher than 110. This information is not intended to replace advice given to you by your health care provider. Make sure you discuss any questions you have with your health care provider. Document Released: 04/12/2016 Document Revised: 06/25/2016 Document Reviewed: 04/12/2016 Elsevier Interactive Patient Education  2018 [employer].         Diabetes Mellitus and Standards of Medical Care  Managing diabetes (diabetes mellitus) can be complicated. Your diabetes treatment may be managed by a team of health care providers, including:  A diet and nutrition specialist (registered dietitian).  A  nurse.  A certified diabetes educator (CDE).  A diabetes specialist (endocrinologist).  An eye doctor.  A primary care provider.  A dentist.  Your health care providers follow a schedule in order to help you get the best quality of care. The following schedule is  a general guideline for your diabetes management plan. Your health care providers may also give you more specific instructions.  HbA1c (hemoglobin A1c) test This test provides information about blood sugar (glucose) control over the previous 2-3 months. It is used to check whether your diabetes management plan needs to be adjusted.  If you are meeting your treatment goals, this test is done at least 2 times a year.  If you are not meeting treatment goals or if your treatment goals have changed, this test is done 4 times a year.  Blood pressure test  This test is done at every routine medical visit. For most people, the goal is less than 130/80. Ask your health care provider what your goal blood pressure should be.  Dental and eye exams  Visit your dentist two times a year.  If you have type 1 diabetes, get an eye exam 3-5 years after you are diagnosed, and then once a year after your first exam. ? If you were diagnosed with type 1 diabetes as a child, get an eye exam when you are age 33 or older and have had diabetes for 3-5 years. After the first exam, you should get an eye exam once a year.  If you have type 2 diabetes, have an eye exam as soon as you are diagnosed, and then once a year after your first exam.  Foot care exam  Visual foot exams are done at every routine medical visit. The exams check for cuts, bruises, redness, blisters, sores, or other problems with the feet.  A complete foot exam is done by your health care provider once a year. This exam includes an inspection of the structure and skin of your feet, and a check of the pulses and sensation in your feet. ? Type 1 diabetes: Get your first exam 3-5  years after diagnosis. ? Type 2 diabetes: Get your first exam as soon as you are diagnosed.  Check your feet every day for cuts, bruises, redness, blisters, or sores. If you have any of these or other problems that are not healing, contact your health care provider.  Kidney function test (urine microalbumin)  This test is done once a year. ? Type 1 diabetes: Get your first test 5 years after diagnosis. ? Type 2 diabetes: Get your first test as soon as you are diagnosed._  If you have chronic kidney disease (CKD), get a serum creatinine and estimated glomerular filtration rate (eGFR) test once a year.  Lipid profile (cholesterol, HDL, LDL, triglycerides)  This test should be done when you are diagnosed with diabetes, and every 5 years after the first test. If you are on medicines to lower your cholesterol, you may need to get this test done every year. ? The goal for LDL is less than 100 mg/dL (5.5 mmol/L). If you are at high risk, the goal is less than 70 mg/dL (3.9 mmol/L). ? The goal for HDL is 40 mg/dL (2.2 mmol/L) for men and 50 mg/dL(2.8 mmol/L) for women. An HDL cholesterol of 60 mg/dL (3.3 mmol/L) or higher gives some protection against heart disease. ? The goal for triglycerides is less than 150 mg/dL (8.3 mmol/L).  Immunizations  The yearly flu (influenza) vaccine is recommended for everyone 6 months or older who has diabetes.  The pneumonia (pneumococcal) vaccine is recommended for everyone 2 years or older who has diabetes. If you are 62 or older, you may get the pneumonia vaccine as a series of two separate shots.  The  hepatitis B vaccine is recommended for adults shortly after they have been diagnosed with diabetes.  The Tdap (tetanus, diphtheria, and pertussis) vaccine should be given: ? According to normal childhood vaccination schedules, for children. ? Every 10 years, for adults who have diabetes.  The shingles vaccine is recommended for people who have had chicken  pox and are 50 years or older.  Mental and emotional health  Screening for symptoms of eating disorders, anxiety, and depression is recommended at the time of diagnosis and afterward as needed. If your screening shows that you have symptoms (you have a positive screening result), you may need further evaluation and be referred to a mental health care provider.  Diabetes self-management education  Education about how to manage your diabetes is recommended at diagnosis and ongoing as needed.  Treatment plan  Your treatment plan will be reviewed at every medical visit.  Summary  Managing diabetes (diabetes mellitus) can be complicated. Your diabetes treatment may be managed by a team of health care providers.  Your health care providers follow a schedule in order to help you get the best quality of care.  Standards of care including having regular physical exams, blood tests, blood pressure monitoring, immunizations, screening tests, and education about how to manage your diabetes.  Your health care providers may also give you more specific instructions based on your individual health.      Type 2 Diabetes Mellitus, Self Care, Adult Caring for yourself after you have been diagnosed with type 2 diabetes (type 2 diabetes mellitus) means keeping your blood sugar (glucose) under control with a balance of:  Nutrition.  Exercise.  Lifestyle changes.  Medicines or insulin, if necessary.  Support from your team of health care providers and others.  The following information explains what you need to know to manage your diabetes at home. What do I need to do to manage my blood glucose?  Check your blood glucose every day, as often as told by your health care provider.  Contact your health care provider if your blood glucose is above your target for 2 tests in a row.  Have your A1c (hemoglobin A1c) level checked at least two times a year, or as often as told by your health care  provider. Your health care provider will set individualized treatment goals for you. Generally, the goal of treatment is to maintain the following blood glucose levels:  Before meals (preprandial): 80-130 mg/dL (4.4-7.2 mmol/L).  After meals (postprandial): below 180 mg/dL (10 mmol/L).  A1c level: less than 7%.  What do I need to know about hyperglycemia and hypoglycemia? What is hyperglycemia? Hyperglycemia, also called high blood glucose, occurs when blood glucose is too high.Make sure you know the early signs of hyperglycemia, such as:  Increased thirst.  Hunger.  Feeling very tired.  Needing to urinate more often than usual.  Blurry vision.  What is hypoglycemia? Hypoglycemia, also called low blood glucose, occurswith a blood glucose level at or below 70 mg/dL (3.9 mmol/L). The risk for hypoglycemia increases during or after exercise, during sleep, during illness, and when skipping meals or not eating for a long time (fasting). It is important to know the symptoms of hypoglycemia and treat it right away. Always have a 15-gram rapid-acting carbohydrate snack with you to treat low blood glucose. Family members and close friends should also know the symptoms and should understand how to treat hypoglycemia, in case you are not able to treat yourself. What are the symptoms of hypoglycemia? Hypoglycemia  symptoms can include:  Hunger.  Anxiety.  Sweating and feeling clammy.  Confusion.  Dizziness or feeling light-headed.  Sleepiness.  Nausea.  Increased heart rate.  Headache.  Blurry vision.  Seizure.  Nightmares.  Tingling or numbness around the mouth, lips, or tongue.  A change in speech.  Decreased ability to concentrate.  A change in coordination.  Restless sleep.  Tremors or shakes.  Fainting.  Irritability.  How do I treat hypoglycemia?  If you are alert and able to swallow safely, follow the 15:15 rule:  Take 15 grams of a rapid-acting  carbohydrate. Rapid-acting options include: ? 1 tube of glucose gel. ? 3 glucose pills. ? 6-8 pieces of hard candy. ? 4 oz (120 mL) of fruit juice. ? 4 oz (120 mL) of regular (not diet) soda.  Check your blood glucose 15 minutes after you take the carbohydrate.  If the repeat blood glucose level is still at or below 70 mg/dL (3.9 mmol/L), take 15 grams of a carbohydrate again.  If your blood glucose level does not increase above 70 mg/dL (3.9 mmol/L) after 3 tries, seek emergency medical care.  After your blood glucose level returns to normal, eat a meal or a snack within 1 hour.  How do I treat severe hypoglycemia? Severe hypoglycemia is when your blood glucose level is at or below 54 mg/dL (3 mmol/L). Severe hypoglycemia is an emergency. Do not wait to see if the symptoms will go away. Get medical help right away. Call your local emergency services (911 in the [place] Do not drive yourself to the hospital. If you have severe hypoglycemia and you cannot eat or drink, you may need an injection of glucagon. A family member or close friend should learn how to check your blood glucose and how to give you a glucagon injection. Ask your health care provider if you need to have an emergency glucagon injection kit available. Severe hypoglycemia may need to be treated in a hospital. The treatment may include getting glucose through an IV tube. You may also need treatment for the cause of your hypoglycemia. Can having diabetes put me at risk for other conditions? Having diabetes can put you at risk for other long-term (chronic) conditions, such as heart disease and kidney disease. Your health care provider may prescribe medicines to help prevent complications from diabetes. These medicines may include:  Aspirin.  Medicine to lower cholesterol.  Medicine to control blood pressure.  What else can I do to manage my diabetes? Take your diabetes medicines as told  If your health care provider  prescribed insulin or diabetes medicines, take them every day.  Do not run out of insulin or other diabetes medicines that you take. Plan ahead so you always have these available.  If you use insulin, adjust your dosage based on how physically active you are and what foods you eat. Your health care provider will tell you how to adjust your dosage. Make healthy food choices  The things that you eat and drink affect your blood glucose and your insulin dosage. Making good choices helps to control your diabetes and prevent other health problems. A healthy meal plan includes eating lean proteins, complex carbohydrates, fresh fruits and vegetables, low-fat dairy products, and healthy fats. Make an appointment to see a diet and nutrition specialist (registered dietitian) to help you create an eating plan that is right for you. Make sure that you:  Follow instructions from your health care provider about eating or drinking restrictions.  Drink  enough fluid to keep your urine clear or pale yellow.  Eat healthy snacks between nutritious meals.  Track the carbohydrates that you eat. Do this by reading food labels and learning the standard serving sizes of foods.  Follow your sick day plan whenever you cannot eat or drink as usual. Make this plan in advance with your health care provider.  Stay active  Exercise regularly, as told by your health care provider. This may include:  Stretching and doing strength exercises, such as yoga or weightlifting, at least 2 times a week.  Doing at least 150 minutes of moderate-intensity or vigorous-intensity exercise each week. This could be brisk walking, biking, or water aerobics. ? Spread out your activity over at least 3 days of the week. ? Do not go more than 2 days in a row without doing some kind of physical activity.  When you start a new exercise or activity, work with your health care provider to adjust your insulin, medicines, or food intake as  needed. Make healthy lifestyle choices  Do not use any tobacco products, such as cigarettes, chewing tobacco, and e-cigarettes. If you need help quitting, ask your health care provider.  If your health care provider says that alcohol is safe for you, limit alcohol intake to no more than 1 drink per day for nonpregnant women and 2 drinks per day for men. One drink equals 12 oz of beer, 5 oz of wine, or 1 oz of hard liquor.  Learn to manage stress. If you need help with this, ask your health care provider. Care for your body   Keep your immunizations up to date. In addition to getting vaccinations as told by your health care provider, it is recommended that you get vaccinated against the following illnesses: ? The flu (influenza). Get a flu shot every year. ? Pneumonia. ? Hepatitis B.  Schedule an eye exam soon after your diagnosis, and then one time every year after that.  Check your skin and feet every day for cuts, bruises, redness, blisters, or sores. Schedule a foot exam with your health care provider once every year.  Brush your teeth and gums two times a day, and floss at least one time a day. Visit your dentist at least once every 6 months.  Maintain a healthy weight. General instructions  Take over-the-counter and prescription medicines only as told by your health care provider.  Share your diabetes management plan with people in your workplace, school, and household.  Check your urine for ketones when you are ill and as told by your health care provider.  Ask your health care provider: ? Do I need to meet with a diabetes educator? ? Where can I find a support group for people with diabetes?  Carry a medical alert card or wear medical alert jewelry.  Keep all follow-up visits as told by your health care provider. This is important. Where to find more information: For more information about diabetes, visit:  American Diabetes Association (ADA):  www.diabetes.org  American Association of Diabetes Educators (AADE): www.diabeteseducator.org/patient-resources  This information is not intended to replace advice given to you by your health care provider. Make sure you discuss any questions you have with your health care provider. Document Released: 02/26/2016 Document Revised: 04/11/2016 Document Reviewed: 12/08/2015 Elsevier Interactive Patient Education  2017 [employer].      Blood Glucose Monitoring, Adult Monitoring your blood sugar (glucose) helps you manage your diabetes. It also helps you and your health care provider determine how  well your diabetes management plan is working. Blood glucose monitoring involves checking your blood glucose as often as directed, and keeping a record (log) of your results over time. Why should I monitor my blood glucose? Checking your blood glucose regularly can:  Help you understand how food, exercise, illnesses, and medicines affect your blood glucose.  Let you know what your blood glucose is at any time. You can quickly tell if you are having low blood glucose (hypoglycemia) or high blood glucose (hyperglycemia).  Help you and your health care provider adjust your medicines as needed.  When should I check my blood glucose? Follow instructions from your health care provider about how often to check your blood glucose.   This may depend on:  The type of diabetes you have.  How well-controlled your diabetes is.  Medicines you are taking.  If you have type 1 diabetes:  Check your blood glucose at least 2 times a day.  Also check your blood glucose: ? Before every insulin injection. ? Before and after exercise. ? Between meals. ? 2 hours after a meal. ? Occasionally between 2:00 a.m. and 3:00 a.m., as directed. ? Before potentially dangerous tasks, like driving or using heavy machinery. ? At bedtime.  You may need to check your blood glucose more often, up to 6-10 times a  day: ? If you use an insulin pump. ? If you need multiple daily injections (MDI). ? If your diabetes is not well-controlled. ? If you are ill. ? If you have a history of severe hypoglycemia. ? If you have a history of not knowing when your blood glucose is getting low (hypoglycemia unawareness).  If you have type 2 diabetes:  If you take insulin or other diabetes medicines, check your blood glucose at least 2 times a day.  If you are on intensive insulin therapy, check your blood glucose at least 4 times a day. Occasionally, you may also need to check between 2:00 a.m. and 3:00 a.m., as directed.  Also check your blood glucose: ? Before and after exercise. ? Before potentially dangerous tasks, like driving or using heavy machinery.  You may need to check your blood glucose more often if: ? Your medicine is being adjusted. ? Your diabetes is not well-controlled. ? You are ill.  What is a blood glucose log?  A blood glucose log is a record of your blood glucose readings. It helps you and your health care provider: ? Look for patterns in your blood glucose over time. ? Adjust your diabetes management plan as needed.  Every time you check your blood glucose, write down your result and notes about things that may be affecting your blood glucose, such as your diet and exercise for the day.  Most glucose meters store a record of glucose readings in the meter. Some meters allow you to download your records to a computer. How do I check my blood glucose? Follow these steps to get accurate readings of your blood glucose: Supplies needed   Blood glucose meter.  Test strips for your meter. Each meter has its own strips. You must use the strips that come with your meter.  A needle to prick your finger (lancet). Do not use lancets more than once.  A device that holds the lancet (lancing device).  A journal or log book to write down your results.  Procedure  Wash your hands with  soap and water.  Prick the side of your finger (not the tip) with the  lancet. Use a different finger each time.  Gently rub the finger until a small drop of blood appears.  Follow instructions that come with your meter for inserting the test strip, applying blood to the strip, and using your blood glucose meter.  Write down your result and any notes.  Alternative testing sites  Some meters allow you to use areas of your body other than your finger (alternative sites) to test your blood.  If you think you may have hypoglycemia, or if you have hypoglycemia unawareness, do not use alternative sites. Use your finger instead.  Alternative sites may not be as accurate as the fingers, because blood flow is slower in these areas. This means that the result you get may be delayed, and it may be different from the result that you would get from your finger.  The most common alternative sites are: ? Forearm. ? Thigh. ? Palm of the hand.  Additional tips  Always keep your supplies with you.  If you have questions or need help, all blood glucose meters have a 24-hour hotline number that you can call. You may also contact your health care provider.  After you use a few boxes of test strips, adjust (calibrate) your blood glucose meter by following instructions that came with your meter.    The American Diabetes Association suggests the following targets for most nonpregnant adults with diabetes.  More or less stringent glycemic goals may be appropriate for each individual.  A1C: Less than 7% A1C may also be reported as eAG: Less than 154 mg/dl Before a meal (preprandial plasma glucose): 80-130 mg/dl 1-2 hours after beginning of the meal (Postprandial plasma glucose)*: Less than 180 mg/dl  *Postprandial glucose may be targeted if A1C goals are not met despite reaching preprandial glucose goals.   GOALS in short:  The goals are for the Hgb A1C to be less than 7.0 & blood pressure to be  less than 130/80.    It is recommended that all diabetics are educated on and follow a healthy diabetic diet, exercise for 30 minutes 3-4 times per week (walking, biking, swimming, or machine), monitor blood glucose readings and bring that record with you to be reviewed at your next office visit.     You should be checking fasting blood sugars- especially after you eat poorly or eat really healthy, and also check 2 hour postprandial blood sugars after largest meal of the day.    Write these down and bring in your log at each office visit.    You will need to be seen every 3 months by the provider managing your Diabetes unless told otherwise by that provider.   You will need yearly eye exams from an eye specialist and foot exams to check the nerves of your feet.  Also, your urine should be checked yearly as well to make sure excess protein is not present.   If you are checking your blood pressure at home, please record it and bring it to your next office visit.    Follow the Dietary Approaches to Stop Hypertension (DASH) diet (3 servings of fruit and vegetables daily, whole grains, low sodium, low-fat proteins).  See below.    Lastly, when it comes to your cholesterol, the goal is to have the HDL (good cholesterol) >40, and the LDL (bad cholesterol) <100.   It is recommended that you follow a heart healthy, low saturated and trans-fat diet and exercise for 30 minutes at least 5 times a week.     ((  Check out the DASH diet = 1.5 Gram Low Sodium Diet   A 1.5 gram sodium diet restricts the amount of sodium in the diet to no more than 1.5 g or 1500 mg daily.  The American Heart Association recommends Americans over the age of 72 to consume no more than 1500 mg of sodium each day to reduce the risk of developing high blood pressure.  Research also shows that limiting sodium may reduce heart attack and stroke risk.  Many foods contain sodium for flavor and sometimes as a preservative.  When the amount of  sodium in a diet needs to be low, it is important to know what to look for when choosing foods and drinks.  The following includes some information and guidelines to help make it easier for you to adapt to a low sodium diet.    QUICK TIPS  Do not add salt to food.  Avoid convenience items and fast food.  Choose unsalted snack foods.  Buy lower sodium products, often labeled as "lower sodium" or "no salt added."  Check food labels to learn how much sodium is in 1 serving.  When eating at a restaurant, ask that your food be prepared with less salt or none, if possible.    READING FOOD LABELS FOR SODIUM INFORMATION  The nutrition facts label is a good place to find how much sodium is in foods. Look for products with no more than 400 mg of sodium per serving.  Remember that 1.5 g = 1500 mg.  The food label may also list foods as:  Sodium-free: Less than 5 mg in a serving.  Very low sodium: 35 mg or less in a serving.  Low-sodium: 140 mg or less in a serving.  Light in sodium: 50% less sodium in a serving. For example, if a food that usually has 300 mg of sodium is changed to become light in sodium, it will have 150 mg of sodium.  Reduced sodium: 25% less sodium in a serving. For example, if a food that usually has 400 mg of sodium is changed to reduced sodium, it will have 300 mg of sodium.    CHOOSING FOODS  Grains  Avoid: Salted crackers and snack items. Some cereals, including instant hot cereals. Bread stuffing and biscuit mixes. Seasoned rice or pasta mixes.  Choose: Unsalted snack items. Low-sodium cereals, oats, puffed wheat and rice, shredded wheat. English muffins and bread. Pasta.  Meats  Avoid: Salted, canned, smoked, spiced, pickled meats, including fish and poultry. Bacon, ham, sausage, cold cuts, hot dogs, anchovies.  Choose: Low-sodium canned tuna and salmon. Fresh or frozen meat, poultry, and fish.  Dairy  Avoid: Processed cheese and spreads. Cottage cheese. Buttermilk and  condensed milk. Regular cheese.  Choose: Milk. Low-sodium cottage cheese. Yogurt. Sour cream. Low-sodium cheese.  Fruits and Vegetables  Avoid: Regular canned vegetables. Regular canned tomato sauce and paste. Frozen vegetables in sauces. Olives. Angie Fava. Relishes. Sauerkraut.  Choose: Low-sodium canned vegetables. Low-sodium tomato sauce and paste. Frozen or fresh vegetables. Fresh and frozen fruit.  Condiments  Avoid: Canned and packaged gravies. Worcestershire sauce. Tartar sauce. Barbecue sauce. Soy sauce. Steak sauce. Ketchup. Onion, garlic, and table salt. Meat flavorings and tenderizers.  Choose: Fresh and dried herbs and spices. Low-sodium varieties of mustard and ketchup. Lemon juice. Tabasco sauce. Horseradish.    SAMPLE 1.5 GRAM SODIUM MEAL PLAN:   Breakfast / Sodium (mg)  1 cup low-fat milk / 143 mg  1 whole-wheat English muffin / 240 mg  1 tbs heart-healthy margarine / 153 mg  1 hard-boiled egg / 139 mg  1 small orange / 0 mg  Lunch / Sodium (mg)  1 cup raw carrots / 76 mg  2 tbs no salt added peanut butter / 5 mg  2 slices whole-wheat bread / 270 mg  1 tbs jelly / 6 mg   cup red grapes / 2 mg  Dinner / Sodium (mg)  1 cup whole-wheat pasta / 2 mg  1 cup low-sodium tomato sauce / 73 mg  3 oz lean ground beef / 57 mg  1 small side salad (1 cup raw spinach leaves,  cup cucumber,  cup yellow bell pepper) with 1 tsp olive oil and 1 tsp red wine vinegar / 25 mg  Snack / Sodium (mg)  1 container low-fat vanilla yogurt / 107 mg  3 graham cracker squares / 127 mg  Nutrient Analysis  Calories: 1745  Protein: 75 g  Carbohydrate: 237 g  Fat: 57 g  Sodium: 1425 mg  Document Released: 11/04/2005 Document Revised: 07/17/2011 Document Reviewed: 02/05/2010  ExitCare Patient Information 2012 [place], [facility].))    This information is not intended to replace advice given to you by your health care provider. Make sure you discuss any questions you have with your health care  provider. Document Released: 11/07/2003 Document Revised: 05/24/2016 Document Reviewed: 04/15/2016 Elsevier Interactive Patient Education  2017 [employer].

## 2019-01-29 LAB — HM DIABETES EYE EXAM

## 2019-03-11 ENCOUNTER — Other Ambulatory Visit: Payer: Self-pay | Admitting: Family Medicine

## 2019-03-11 DIAGNOSIS — E1159 Type 2 diabetes mellitus with other circulatory complications: Secondary | ICD-10-CM

## 2019-03-11 NOTE — Telephone Encounter (Signed)
Medications last filled by previous provider.  LOV 01/22/2019.  Please review and advise.  MPulliam, CMA/RT(R)

## 2019-03-11 NOTE — Telephone Encounter (Signed)
  Melissa,   Every one of these meds were RFed by me in the past, this did not need to be authorized by me per our clinic protocol for RFs.   In the future, you can go under meds and click "not current ones" and you will see all I prescribed for pt in the past.    Thanks,   Dr Val Eagle

## 2019-04-01 ENCOUNTER — Other Ambulatory Visit: Payer: 59

## 2019-04-01 ENCOUNTER — Other Ambulatory Visit: Payer: Self-pay

## 2019-04-01 DIAGNOSIS — E559 Vitamin D deficiency, unspecified: Secondary | ICD-10-CM

## 2019-04-01 DIAGNOSIS — E119 Type 2 diabetes mellitus without complications: Secondary | ICD-10-CM

## 2019-04-01 DIAGNOSIS — E1159 Type 2 diabetes mellitus with other circulatory complications: Secondary | ICD-10-CM

## 2019-04-01 DIAGNOSIS — I152 Hypertension secondary to endocrine disorders: Secondary | ICD-10-CM

## 2019-04-01 DIAGNOSIS — E1169 Type 2 diabetes mellitus with other specified complication: Secondary | ICD-10-CM

## 2019-04-01 DIAGNOSIS — E782 Mixed hyperlipidemia: Secondary | ICD-10-CM

## 2019-04-02 ENCOUNTER — Other Ambulatory Visit: Payer: Self-pay | Admitting: Family Medicine

## 2019-04-02 DIAGNOSIS — E559 Vitamin D deficiency, unspecified: Secondary | ICD-10-CM

## 2019-04-02 LAB — COMPREHENSIVE METABOLIC PANEL
ALT: 14 IU/L (ref 0–32)
AST: 15 IU/L (ref 0–40)
Albumin/Globulin Ratio: 1.6 (ref 1.2–2.2)
Albumin: 4.5 g/dL (ref 3.8–4.9)
Alkaline Phosphatase: 120 IU/L — ABNORMAL HIGH (ref 39–117)
BUN/Creatinine Ratio: 18 (ref 9–23)
BUN: 15 mg/dL (ref 6–24)
Bilirubin Total: 0.5 mg/dL (ref 0.0–1.2)
CO2: 26 mmol/L (ref 20–29)
Calcium: 9.5 mg/dL (ref 8.7–10.2)
Chloride: 103 mmol/L (ref 96–106)
Creatinine, Ser: 0.85 mg/dL (ref 0.57–1.00)
GFR calc Af Amer: 87 mL/min/{1.73_m2} (ref >59)
GFR calc non Af Amer: 75 mL/min/{1.73_m2} (ref >59)
Globulin, Total: 2.8 g/dL (ref 1.5–4.5)
Glucose: 100 mg/dL — ABNORMAL HIGH (ref 65–99)
Potassium: 4.2 mmol/L (ref 3.5–5.2)
Sodium: 143 mmol/L (ref 134–144)
Total Protein: 7.3 g/dL (ref 6.0–8.5)

## 2019-04-02 LAB — LIPID PANEL
Chol/HDL Ratio: 2.7 ratio (ref 0.0–4.4)
Cholesterol, Total: 117 mg/dL (ref 100–199)
HDL: 44 mg/dL (ref >39)
LDL Calculated: 47 mg/dL (ref 0–99)
Triglycerides: 130 mg/dL (ref 0–149)
VLDL Cholesterol Cal: 26 mg/dL (ref 5–40)

## 2019-04-02 LAB — HEMOGLOBIN A1C
Est. average glucose Bld gHb Est-mCnc: 126 mg/dL
Hgb A1c MFr Bld: 6 % — ABNORMAL HIGH (ref 4.8–5.6)

## 2019-04-02 LAB — VITAMIN D 25 HYDROXY (VIT D DEFICIENCY, FRACTURES): Vit D, 25-Hydroxy: 78.9 ng/mL (ref 30.0–100.0)

## 2019-04-02 MED ORDER — VITAMIN D (ERGOCALCIFEROL) 1.25 MG (50000 UNIT) PO CAPS: ORAL_CAPSULE | ORAL | 3 refills | Status: DC

## 2019-04-02 NOTE — Progress Notes (Signed)
Based on recent labs and also on patient's compliance with medications now, she was told to decrease ergocalciferol to 1 tablet weekly not 2 tablets weekly.    This medication change was made and patient was sent a MyChart message about this change.

## 2019-04-15 ENCOUNTER — Other Ambulatory Visit: Payer: Self-pay

## 2019-04-15 ENCOUNTER — Encounter: Payer: Self-pay | Admitting: Family Medicine

## 2019-04-15 ENCOUNTER — Ambulatory Visit (INDEPENDENT_AMBULATORY_CARE_PROVIDER_SITE_OTHER): Payer: 59 | Admitting: Family Medicine

## 2019-04-15 VITALS — BP 123/63 | HR 70 | Ht 62.0 in | Wt 174.0 lb

## 2019-04-15 DIAGNOSIS — R7989 Other specified abnormal findings of blood chemistry: Secondary | ICD-10-CM | POA: Diagnosis not present

## 2019-04-15 DIAGNOSIS — E782 Mixed hyperlipidemia: Secondary | ICD-10-CM

## 2019-04-15 DIAGNOSIS — E1169 Type 2 diabetes mellitus with other specified complication: Secondary | ICD-10-CM | POA: Diagnosis not present

## 2019-04-15 DIAGNOSIS — Z8249 Family history of ischemic heart disease and other diseases of the circulatory system: Secondary | ICD-10-CM

## 2019-04-15 DIAGNOSIS — E559 Vitamin D deficiency, unspecified: Secondary | ICD-10-CM | POA: Diagnosis not present

## 2019-04-15 DIAGNOSIS — I10 Essential (primary) hypertension: Secondary | ICD-10-CM

## 2019-04-15 DIAGNOSIS — E1159 Type 2 diabetes mellitus with other circulatory complications: Secondary | ICD-10-CM

## 2019-04-15 MED ORDER — LOSARTAN POTASSIUM 100 MG PO TABS: 50.0000 mg | ORAL_TABLET | Freq: Every day | ORAL | Status: DC

## 2019-04-15 MED ORDER — AMLODIPINE BESYLATE 5 MG PO TABS: 2.5000 mg | ORAL_TABLET | Freq: Every day | ORAL | Status: DC

## 2019-04-15 MED ORDER — ATORVASTATIN CALCIUM 20 MG PO TABS: 20.0000 mg | ORAL_TABLET | Freq: Every day | ORAL | Status: DC

## 2019-04-15 MED ORDER — METFORMIN HCL 500 MG PO TABS: 250.0000 mg | ORAL_TABLET | Freq: Two times a day (BID) | ORAL | 1 refills | Status: DC

## 2019-04-15 NOTE — Progress Notes (Signed)
Telehealth office visit note for Mellody Dance, D.O- at Primary Care at [facility]   I connected with current patient today and verified that I am speaking with the correct person using two identifiers.   . Location of the patient: Home . Location of the provider: Office Only the patient (+/- their family members at pt's discretion) and myself were participating in the encounter    - This visit type was conducted due to national recommendations for restrictions regarding the COVID-19 Pandemic (e.g. social distancing) in an effort to limit this patient's exposure and mitigate transmission in our community.  This format is felt to be most appropriate for this patient at this time.   - The patient did not have access to video technology or had technical difficulties with video requiring transitioning to audio format only. - No physical exam could be performed with this format, beyond that communicated to [place] by the patient/ family members as noted.   - Additionally my office staff/ schedulers discussed with the patient that there may be a monetary charge related to this service, depending on their medical insurance.   The patient expressed understanding, and agreed to proceed.       History of Present Illness: -4 months ago we made changes to her medications, she started cholesterol meds in February etc.  Prior she was not taking her meds as prescribed and was not monitoring blood pressure blood sugar etc. at home. -We recently obtained new labs on her and she is here to review them.  Overall they are all significantly improved.  Patient has been better with her diet, consistent with checking blood sugars blood pressures and taking all meds.  She is feels much better with improvement in her blood sugars, vitamin D levels etc.   Dm:  Actually checking BS.  FBS's all under 120.  2 hr PP- 159, 150.  She is taking 1/2 tab in am and 1/2 tab in PM- occ 1 tab bid if eating poorly.   Last A1C  in the office was:  Lab Results  Component Value Date   HGBA1C 6.0 (H) 04/01/2019   HGBA1C 7.1 (H) 12/01/2018    Lab Results  Component Value Date   LDLCALC 47 04/01/2019   CREATININE 0.85 04/01/2019    Wt Readings from Last 3 Encounters:  04/15/19 174 lb (78.9 kg)  01/22/19 179 lb (81.2 kg)  12/11/18 189 lb (85.7 kg)    BP Readings from Last 3 Encounters:  04/15/19 123/63  01/22/19 122/76  12/11/18 (!) 175/79    BP:  Highest is 133/66, usually 100/62, 119/69, 112/62.   Chol: Started her Lipitor in February.  Txmnt compliance-excellent  Patient reports very little compliance with low chol/ saturated and trans fat diet.  No exercise  RUQ pain- none   Muscle aches- none  No other s-e  Last lipid panel as follows:  Lab Results  Component Value Date   CHOL 117 04/01/2019   HDL 44 04/01/2019   LDLCALC 47 04/01/2019   TRIG 130 04/01/2019   CHOLHDL 2.7 04/01/2019    Hepatic Function Latest Ref Rng & Units 04/01/2019 01/20/2019 12/01/2018  Total Protein 6.0 - 8.5 g/dL 7.3 7.2 7.5  Albumin 3.8 - 4.9 g/dL 4.5 4.4 4.6  AST 0 - 40 IU/L '15 24 27  ' ALT 0 - 32 IU/L '14 23 24  ' Alk Phosphatase 39 - 117 IU/L 120(H) 98 104  Total Bilirubin 0.0 - 1.2 mg/dL 0.5 0.4  0.7    Hypothyroidism: We will put her on a small amount of levothyroxine.  Now all levels have normalized.  She feels she has much more energy and feels more like herself now   Vit D- went from 11 to 8; we stopped her wtwice wkly vit D and now once wkly   Recent Results (from the past 2160 hour(s))  T3, free     Status: None   Collection Time: 01/20/19 11:29 AM  Result Value Ref Range   T3, Free 2.7 2.0 - 4.4 pg/mL  T4, free     Status: None   Collection Time: 01/20/19 11:29 AM  Result Value Ref Range   Free T4 1.05 0.82 - 1.77 ng/dL  TSH     Status: None   Collection Time: 01/20/19 11:29 AM  Result Value Ref Range   TSH 3.150 0.450 - 4.500 uIU/mL  Comprehensive metabolic panel     Status: None    Collection Time: 01/20/19 11:29 AM  Result Value Ref Range   Glucose 95 65 - 99 mg/dL   BUN 8 6 - 24 mg/dL   Creatinine, Ser 0.75 0.57 - 1.00 mg/dL   GFR calc non Af Amer 88 >59 mL/min/1.73   GFR calc Af Amer 101 >59 mL/min/1.73   BUN/Creatinine Ratio 11 9 - 23   Sodium 142 134 - 144 mmol/L   Potassium 4.1 3.5 - 5.2 mmol/L   Chloride 104 96 - 106 mmol/L   CO2 23 20 - 29 mmol/L   Calcium 9.5 8.7 - 10.2 mg/dL   Total Protein 7.2 6.0 - 8.5 g/dL   Albumin 4.4 3.8 - 4.9 g/dL   Globulin, Total 2.8 1.5 - 4.5 g/dL   Albumin/Globulin Ratio 1.6 1.2 - 2.2   Bilirubin Total 0.4 0.0 - 1.2 mg/dL   Alkaline Phosphatase 98 39 - 117 IU/L   AST 24 0 - 40 IU/L   ALT 23 0 - 32 IU/L  Comprehensive metabolic panel     Status: Abnormal   Collection Time: 04/01/19  9:03 AM  Result Value Ref Range   Glucose 100 (H) 65 - 99 mg/dL   BUN 15 6 - 24 mg/dL   Creatinine, Ser 0.85 0.57 - 1.00 mg/dL   GFR calc non Af Amer 75 >59 mL/min/1.73   GFR calc Af Amer 87 >59 mL/min/1.73   BUN/Creatinine Ratio 18 9 - 23   Sodium 143 134 - 144 mmol/L   Potassium 4.2 3.5 - 5.2 mmol/L   Chloride 103 96 - 106 mmol/L   CO2 26 20 - 29 mmol/L   Calcium 9.5 8.7 - 10.2 mg/dL   Total Protein 7.3 6.0 - 8.5 g/dL   Albumin 4.5 3.8 - 4.9 g/dL   Globulin, Total 2.8 1.5 - 4.5 g/dL   Albumin/Globulin Ratio 1.6 1.2 - 2.2   Bilirubin Total 0.5 0.0 - 1.2 mg/dL   Alkaline Phosphatase 120 (H) 39 - 117 IU/L   AST 15 0 - 40 IU/L   ALT 14 0 - 32 IU/L  VITAMIN D 25 Hydroxy (Vit-D Deficiency, Fractures)     Status: None   Collection Time: 04/01/19  9:03 AM  Result Value Ref Range   Vit D, 25-Hydroxy 78.9 30.0 - 100.0 ng/mL    Comment: Vitamin D deficiency has been defined by the Institute of Medicine and an Endocrine Society practice guideline as a level of serum 25-OH vitamin D less than 20 ng/mL (1,2). The Endocrine Society went on to further define vitamin  D insufficiency as a level between 21 and 29 ng/mL (2). 1. IOM (Institute of  Medicine). 2010. Dietary reference    intakes for calcium and D. [place]: The    [employer]. 2. Holick MF, Binkley , Bischoff-Ferrari HA, et al.    Evaluation, treatment, and prevention of vitamin D    deficiency: an Endocrine Society clinical practice    guideline. JCEM. 2011 Jul; 96(7):1911-30.   Hemoglobin A1c     Status: Abnormal   Collection Time: 04/01/19  9:03 AM  Result Value Ref Range   Hgb A1c MFr Bld 6.0 (H) 4.8 - 5.6 %    Comment:          Prediabetes: 5.7 - 6.4          Diabetes: >6.4          Glycemic control for adults with diabetes: <7.0    Est. average glucose Bld gHb Est-mCnc 126 mg/dL  Lipid panel     Status: None   Collection Time: 04/01/19  9:03 AM  Result Value Ref Range   Cholesterol, Total 117 100 - 199 mg/dL   Triglycerides 130 0 - 149 mg/dL   HDL 44 >39 mg/dL   VLDL Cholesterol Cal 26 5 - 40 mg/dL   LDL Calculated 47 0 - 99 mg/dL   Chol/HDL Ratio 2.7 0.0 - 4.4 ratio    Comment:                                   T. Chol/HDL Ratio                                             Men  Women                               1/2 Avg.Risk  3.4    3.3                                   Avg.Risk  5.0    4.4                                2X Avg.Risk  9.6    7.1                                3X Avg.Risk 23.4   11.0             Impression and Recommendations:    1. Type 2 diabetes mellitus with other specified complication, without long-term current use of insulin ([place])   2. Hypertension associated with diabetes ([place])   3. Mixed diabetic hyperlipidemia associated with type 2 diabetes mellitus ([name])   4. Elevated TSH-  with significant fatigue and symptoms   5. Vitamin D deficiency   6. FHx: early coronary artery disease   7. Type 2 diabetes mellitus with other circulatory complications ([name])      BP: Discontinue her half tablet of hydrochlorothiazide daily due to blood pressures being so well controlled and one-time having a little bit  of  dizziness.  She will continue the half tablet of amlodipine and half tablet of losartan.  She will continue to monitor her blood pressure and let us know sooner than planned follow-up if she is having a spike in her blood pressure with this change.  Diabetes: A1c went from 7.1 down to 6.  She is doing fantastic.  She will continue the half tablet of metformin twice daily.   --- Congratulated patient on the fact that she is being more compliant with her diet, medications and lifestyle changes.   -She understands need for yearly eye exam, foot exam and need for urine microalbumin to creatinine ratio was mention to patient as well.  Hyperlipidemia: LDL went from the 120s down into the 40s.  This is excellent.  Also had decrease in her triglycerides and an increase in her HDL.  She has no side effects; and LFTs were within normal limits.   We will continue on the current dose of Lipitor.  -Vitamin D: Since levels increase greatly on twice weekly vitamin D, she will decrease to once weekly vitamin D.  We changed this when labs came back.  Continue to monitor.  Hypo-thyroidism: She will continue on current dose of Synthroid.  TSH levels have normalized and patient feels great.  - As part of my medical decision making, I reviewed the following data within the [place] History obtained from pt /family, CMA notes reviewed and incorporated if applicable, Labs reviewed, Radiograph/ tests reviewed if applicable and OV notes from prior OV's with me, as well as other specialists she/he has seen since seeing me last, were all reviewed and used in my medical decision making process today.   - Additionally, discussion had with patient regarding txmnt plan, and their biases/concerns about that plan were used in my medical decision making today.   - The patient agreed with the plan and demonstrated an understanding of the instructions.   No barriers to understanding were identified.   - Red flag  symptoms and signs discussed in detail.  Patient expressed understanding regarding what to do in case of emergency\ urgent symptoms.  The patient was advised to call back or seek an in-person evaluation if the symptoms worsen or if the condition fails to improve as anticipated.   Return for DM, HTN, HLD follow up in 4 mo since well controlled- bring BP and BS log.    Meds ordered this encounter  Medications  . metFORMIN (GLUCOPHAGE) 500 MG tablet    Sig: Take 0.5 tablets (250 mg total) by mouth 2 (two) times daily with a meal.    Dispense:  180 tablet    Refill:  1  . amLODipine (NORVASC) 5 MG tablet    Sig: Take 0.5 tablets (2.5 mg total) by mouth daily.  Marland Kitchen losartan (COZAAR) 100 MG tablet    Sig: Take 0.5 tablets (50 mg total) by mouth daily.  Marland Kitchen atorvastatin (LIPITOR) 20 MG tablet    Sig: Take 1 tablet (20 mg total) by mouth at bedtime.    Dispense:  90 tablet    Medications Discontinued During This Encounter  Medication Reason  . hydrochlorothiazide (HYDRODIURIL) 12.5 MG tablet   . metFORMIN (GLUCOPHAGE) 500 MG tablet Reorder  . amLODipine (NORVASC) 5 MG tablet Reorder  . losartan (COZAAR) 100 MG tablet Reorder  . atorvastatin (LIPITOR) 20 MG tablet Reorder      I provided 22 minutes of non-face-to-face time during this encounter,with over 50% of the time in direct counseling  on patients medical conditions/ medical concerns.  Additional time was spent with charting and coordination of care after the actual visit commenced.   Note:  This note was prepared with assistance of Dragon voice recognition software. Occasional wrong-word or sound-a-like substitutions may have occurred due to the inherent limitations of voice recognition software.  Mellody Dance, DO     Patient Care Team    Relationship Specialty Notifications Start End  Mellody Dance, DO PCP - General Family Medicine  10/02/18   Everlene Farrier, MD Consulting Physician Obstetrics and Gynecology  10/02/18    Gastroenterology, [facility]    12/02/18   [facility], [facility]    12/02/18      -Vitals obtained; medications/ allergies reconciled;  personal medical, social, Sx etc.histories were updated by CMA, reviewed by me and are reflected in chart   Patient Active Problem List   Diagnosis Date Noted  . OBesity assoc with DM, HTN, Chol, hypothyroidism, vit D def etc 01/22/2019    Priority: High  . Diabetes mellitus ([place]) 12/11/2018    Priority: High  . Hypertension associated with diabetes ([place]) 12/11/2018    Priority: High  . Mixed diabetic hyperlipidemia associated with type 2 diabetes mellitus ([place]) 12/11/2018    Priority: High  . FHx: early coronary artery disease 10/02/2018    Priority: High  . Elevated TSH-  with significant fatigue and symptoms 12/11/2018    Priority: Medium  . Vitamin D deficiency 12/11/2018    Priority: Low  . Chronic fatigue 12/11/2018  . Family history of diabetes mellitus (DM)- aunt 10/02/2018  . Family history of early death-  mother age 37 with history of RA 10/02/2018  . Family history of rheumatoid arthritis-mom 10/02/2018  . Family history of primary liver cancer 10/02/2018  . Salt craving/  eats a lot of salt 10/02/2018     Current Meds  Medication Sig  . blood glucose meter kit and supplies Dispense based on patient and insurance preference. Use to check glucose levels fasting in the AM and 2 hours after largest meal. (FOR ICD-10 E10.9, E11.9).  Marland Kitchen levothyroxine (SYNTHROID, LEVOTHROID) 25 MCG tablet Take 12.5 mcg by mouth daily before breakfast.  . Vitamin D, Ergocalciferol, (DRISDOL) 1.25 MG (50000 UT) CAPS capsule One tab weekly  . [DISCONTINUED] amLODipine (NORVASC) 5 MG tablet TAKE 1 TABLET BY MOUTH EVERY DAY (Patient taking differently: Take 2.5 mg by mouth daily. )  . [DISCONTINUED] atorvastatin (LIPITOR) 20 MG tablet Take 20 mg by mouth daily.   . [DISCONTINUED] hydrochlorothiazide (HYDRODIURIL) 12.5 MG tablet TAKE 1 TABLET BY MOUTH EVERY DAY  (Patient taking differently: Take 6.25 mg by mouth daily. )  . [DISCONTINUED] losartan (COZAAR) 100 MG tablet TAKE 1 TABLET BY MOUTH EVERY DAY (Patient taking differently: Take 50 mg by mouth daily. )  . [DISCONTINUED] metFORMIN (GLUCOPHAGE) 500 MG tablet Take by mouth 2 (two) times daily with a meal. 1/2 bid daily - patient will do a whole tablet bid at times  . amLODipine (NORVASC) 5 MG tablet Take 0.5 tablets (2.5 mg total) by mouth daily.  Marland Kitchen atorvastatin (LIPITOR) 20 MG tablet Take 1 tablet (20 mg total) by mouth at bedtime.  Marland Kitchen losartan (COZAAR) 100 MG tablet Take 0.5 tablets (50 mg total) by mouth daily.  . metFORMIN (GLUCOPHAGE) 500 MG tablet Take 0.5 tablets (250 mg total) by mouth 2 (two) times daily with a meal.     Allergies:  No Known Allergies   ROS:  See above HPI for pertinent positives and  negatives   Objective:   Blood pressure 123/63, pulse 70, height '5\' 2"'  (1.575 m), weight 174 lb (78.9 kg).  (if some vitals are omitted, this means that patient was UNABLE to obtain them even though they were asked to get them prior to OV today.  They were asked to call us at their earliest convenience with these once obtained. )  General: A & O * 3; sounds in no acute distress; in usual state of health.  Skin: Pt confirms warm and dry extremities and pink fingertips HEENT: Pt confirms lips non-cyanotic Chest: Patient confirms normal chest excursion and movement Respiratory: speaking in full sentences, no conversational dyspnea; patient confirms no use of accessory muscles Psych: insight appears good, mood- appears full

## 2019-05-11 ENCOUNTER — Other Ambulatory Visit: Payer: Self-pay | Admitting: Family Medicine

## 2019-05-11 DIAGNOSIS — E1159 Type 2 diabetes mellitus with other circulatory complications: Secondary | ICD-10-CM

## 2019-05-11 DIAGNOSIS — I152 Hypertension secondary to endocrine disorders: Secondary | ICD-10-CM

## 2019-05-11 MED ORDER — AMLODIPINE BESYLATE 5 MG PO TABS: 2.5000 mg | ORAL_TABLET | Freq: Every day | ORAL | 0 refills | Status: DC

## 2019-05-11 MED ORDER — LOSARTAN POTASSIUM 100 MG PO TABS: 50.0000 mg | ORAL_TABLET | Freq: Every day | ORAL | 0 refills | Status: DC

## 2019-08-04 ENCOUNTER — Telehealth: Payer: Self-pay

## 2019-08-04 ENCOUNTER — Other Ambulatory Visit: Payer: Self-pay | Admitting: Family Medicine

## 2019-08-04 DIAGNOSIS — E1159 Type 2 diabetes mellitus with other circulatory complications: Secondary | ICD-10-CM

## 2019-08-04 NOTE — Telephone Encounter (Signed)
Please call pt to schedule in office f/u with Dr. Raliegh Scarlet.  No further refills until pt is seen.  Charyl Bigger, CMA

## 2019-08-17 ENCOUNTER — Other Ambulatory Visit: Payer: Self-pay | Admitting: Family Medicine

## 2019-08-17 DIAGNOSIS — E1159 Type 2 diabetes mellitus with other circulatory complications: Secondary | ICD-10-CM

## 2019-08-19 ENCOUNTER — Other Ambulatory Visit: Payer: Self-pay | Admitting: Family Medicine

## 2019-08-19 ENCOUNTER — Telehealth: Payer: Self-pay

## 2019-08-19 DIAGNOSIS — I152 Hypertension secondary to endocrine disorders: Secondary | ICD-10-CM

## 2019-08-19 DIAGNOSIS — E1159 Type 2 diabetes mellitus with other circulatory complications: Secondary | ICD-10-CM

## 2019-08-19 NOTE — Telephone Encounter (Signed)
Please call pt to schedule f/u.  No further refills until pt is seen.  T. Catie Chiao, CMA 

## 2019-08-20 ENCOUNTER — Telehealth: Payer: Self-pay | Admitting: Family Medicine

## 2019-08-20 NOTE — Telephone Encounter (Signed)
Called pt to set up :  Please call pt to schedule f/u.  No further refills until pt is seen.  Charyl Bigger, CMA  --Left pt message to call office-- forwarding note to medical asst.  --glh

## 2019-09-03 ENCOUNTER — Other Ambulatory Visit: Payer: Self-pay

## 2019-09-03 DIAGNOSIS — E1159 Type 2 diabetes mellitus with other circulatory complications: Secondary | ICD-10-CM

## 2019-11-21 ENCOUNTER — Other Ambulatory Visit: Payer: Self-pay | Admitting: Family Medicine

## 2019-11-21 DIAGNOSIS — E1159 Type 2 diabetes mellitus with other circulatory complications: Secondary | ICD-10-CM

## 2019-11-26 ENCOUNTER — Encounter: Payer: Self-pay | Admitting: Family Medicine

## 2019-11-26 ENCOUNTER — Other Ambulatory Visit: Payer: Self-pay

## 2019-11-26 ENCOUNTER — Ambulatory Visit (INDEPENDENT_AMBULATORY_CARE_PROVIDER_SITE_OTHER): Payer: 59 | Admitting: Family Medicine

## 2019-11-26 DIAGNOSIS — E1169 Type 2 diabetes mellitus with other specified complication: Secondary | ICD-10-CM

## 2019-11-26 DIAGNOSIS — R7989 Other specified abnormal findings of blood chemistry: Secondary | ICD-10-CM

## 2019-11-26 DIAGNOSIS — E559 Vitamin D deficiency, unspecified: Secondary | ICD-10-CM

## 2019-11-26 DIAGNOSIS — Z8249 Family history of ischemic heart disease and other diseases of the circulatory system: Secondary | ICD-10-CM

## 2019-11-26 DIAGNOSIS — E1159 Type 2 diabetes mellitus with other circulatory complications: Secondary | ICD-10-CM | POA: Diagnosis not present

## 2019-11-26 DIAGNOSIS — I10 Essential (primary) hypertension: Secondary | ICD-10-CM

## 2019-11-26 DIAGNOSIS — E782 Mixed hyperlipidemia: Secondary | ICD-10-CM

## 2019-11-26 DIAGNOSIS — I152 Hypertension secondary to endocrine disorders: Secondary | ICD-10-CM

## 2019-11-26 DIAGNOSIS — Z1331 Encounter for screening for depression: Secondary | ICD-10-CM

## 2019-11-26 MED ORDER — ATORVASTATIN CALCIUM 20 MG PO TABS: 20.0000 mg | ORAL_TABLET | Freq: Every day | ORAL | 0 refills | Status: DC

## 2019-11-26 MED ORDER — AMLODIPINE BESYLATE 5 MG PO TABS: 2.5000 mg | ORAL_TABLET | Freq: Every day | ORAL | 0 refills | Status: DC

## 2019-11-26 MED ORDER — METFORMIN HCL 500 MG PO TABS: 250.0000 mg | ORAL_TABLET | Freq: Two times a day (BID) | ORAL | 0 refills | Status: DC

## 2019-11-26 MED ORDER — LOSARTAN POTASSIUM 100 MG PO TABS: 50.0000 mg | ORAL_TABLET | Freq: Every day | ORAL | 0 refills | Status: DC

## 2019-11-26 MED ORDER — LEVOTHYROXINE SODIUM 25 MCG PO TABS: 12.5000 ug | ORAL_TABLET | Freq: Every day | ORAL | 0 refills | Status: DC

## 2019-11-26 NOTE — Progress Notes (Signed)
Telehealth office visit note for Diane Carter, D.O- at Primary Care at [facility]   I connected with current patient today and verified that I am speaking with the correct person using two identifiers.   . Location of the patient: Home . Location of the provider: Office Only the patient (+/- their family members at pt's discretion) and myself were participating in the encounter - This visit type was conducted due to national recommendations for restrictions regarding the COVID-19 Pandemic (e.g. social distancing) in an effort to limit this patient's exposure and mitigate transmission in our community.  This format is felt to be most appropriate for this patient at this time.   - The patient did not have access to video technology or had technical difficulties with video requiring transitioning to audio format only. - No physical exam could be performed with this format, beyond that communicated to [place] by the patient/ family members as noted.   - Additionally my office staff/ schedulers discussed with the patient that there may be a monetary charge related to this service, depending on their medical insurance.   The patient expressed understanding, and agreed to proceed.       History of Present Illness: Hypertension, Hyperlipidemia, and Diabetes   I, Toni Amend, am serving as scribe for Dr. Mellody Carter.  Notes she got completely off-track with the COVID-19 virus; "I never even thought about calling back."  States she didn't think to make another follow-up appointment.  - Lifestyle during COVID-19 Patient states she's "slacked off;" remarks that she's gained weight and quit walking.  Notes she works 70 hours a week during the winter time.  Says "we kinda quit walking before that because we had so much going on at home."  Says "I've been a basket case."  "It's really irritating because I had lost weight and now I've gained some of it back."  - Vitamin D Hasn't been  taking her Vitamin D.  States she felt she was out in the sun more over the summer, and perhaps didn't need it as much, and that her dosage was cut back.  "I don't even know how much I'm supposed to take it now."  Says she thought that the numbers had come up on that.  - Hypothyroidism She continues levothyroxine as prescribed.  HPI:   Diabetes Mellitus:  Home glucose readings: Hasn't been checking them.  Notes it was 135 the other morning when she checked it.   - Patient reports good compliance with therapy plan: medication and/or lifestyle modification.  Continues on 250 mg twice daily, half tablet of metformin in the morning, and half tablet in the evening.  Notes back when she was exercising more often, her blood sugar got so low one day in August "I thought I was going to die."  Says she couldn't function; she just went home and went to bed.  Her sugar was 85 at that time.  - Her denies acute concerns or problems related to treatment plan  - She denies new concerns.  Denies polyuria/polydipsia, hypo/ hyperglycemia symptoms.  Denies new onset of: chest pain, exercise intolerance, shortness of breath, dizziness, visual changes, headache, lower extremity swelling or claudication.   Last A1C in the office was:  Lab Results  Component Value Date   HGBA1C 6.0 (H) 04/01/2019   HGBA1C 7.1 (H) 12/01/2018   Lab Results  Component Value Date   LDLCALC 47 04/01/2019   CREATININE 0.85 04/01/2019   BP  Readings from Last 3 Encounters:  11/26/19 140/74  04/15/19 123/63  01/22/19 122/76   Wt Readings from Last 3 Encounters:  04/15/19 174 lb (78.9 kg)  01/22/19 179 lb (81.2 kg)  12/11/18 189 lb (85.7 kg)   HPI:  Hypertension:  -  Her blood pressure at home has been running: Hasn't been checking regularly.  Notes her BP was 140/74 last check.  - Patient reports good compliance with medication and/or lifestyle modification.  Continues amlodipine and losartan as prescribed.  - Her  denies acute concerns or problems related to treatment plan  - She denies new onset of: chest pain, exercise intolerance, shortness of breath, dizziness, visual changes, headache, lower extremity swelling or claudication.   Last 3 blood pressure readings in our office are as follows: BP Readings from Last 3 Encounters:  11/26/19 140/74  04/15/19 123/63  01/22/19 122/76   There were no vitals filed for this visit.  HPI:  Hyperlipidemia:  59 y.o. female here for cholesterol follow-up.   - Notes she often forgets to take her statin at night.  - Patient denies any acute concerns or problems with management plan   - She denies new onset of: myalgias, arthralgias, increased fatigue more than normal, chest pains, exercise intolerance, shortness of breath, dizziness, visual changes, headache, lower extremity swelling or claudication.   Most recent cholesterol panel was:  Lab Results  Component Value Date   CHOL 117 04/01/2019   HDL 44 04/01/2019   LDLCALC 47 04/01/2019   TRIG 130 04/01/2019   CHOLHDL 2.7 04/01/2019   Hepatic Function Latest Ref Rng & Units 04/01/2019 01/20/2019 12/01/2018  Total Protein 6.0 - 8.5 g/dL 7.3 7.2 7.5  Albumin 3.8 - 4.9 g/dL 4.5 4.4 4.6  AST 0 - 40 IU/L '15 24 27  ' ALT 0 - 32 IU/L '14 23 24  ' Alk Phosphatase 39 - 117 IU/L 120(H) 98 104  Total Bilirubin 0.0 - 1.2 mg/dL 0.5 0.4 0.7       GAD 7 : Generalized Anxiety Score 04/15/2019  Nervous, Anxious, on Edge 0  Control/stop worrying 0  Worry too much - different things 0  Trouble relaxing 0  Restless 0  Easily annoyed or irritable 0  Afraid - awful might happen 0  Total GAD 7 Score 0  Anxiety Difficulty Not difficult at all    Depression screen [facility] 2/9 11/26/2019 04/15/2019 01/22/2019 12/11/2018 12/01/2018  Decreased Interest 0 0 0 0 0  Down, Depressed, Hopeless 0 0 0 0 0  PHQ - 2 Score 0 0 0 0 0  Altered sleeping 0 0 0 0 0  Tired, decreased energy 0 0 '1 3 2  ' Change in appetite 0 0 0 0 0  Feeling bad  or failure about yourself  0 0 0 0 0  Trouble concentrating 0 0 0 0 0  Moving slowly or fidgety/restless 0 0 0 0 0  Suicidal thoughts 0 0 0 0 0  PHQ-9 Score 0 0 '1 3 2  ' Difficult doing work/chores - Not difficult at all - Not difficult at all -      Impression and Recommendations:    1. OBesity assoc with DM, HTN, Chol, hypothyroidism, vit D def etc   2. Type 2 diabetes mellitus with other circulatory complications ([name])   3. Hypertension associated with diabetes ([place])   4. Mixed diabetic hyperlipidemia associated with type 2 diabetes mellitus ([name])   5. FHx: early coronary artery disease   6. Elevated TSH-  with significant  fatigue and symptoms   7. Vitamin D deficiency   8. Type 2 diabetes mellitus with other specified complication, without long-term current use of insulin ([name])      - Discussed goal is to keep the patient healthy and well to improve wellbeing and reduce health risks.  Type 2 Diabetes Mellitus - A1c last checked eight months ago. - A1c last check was stable, 6.0. - Discussed need for re-check A1c ASAP to assess need for adjustments to treatment plan.  - Pt will continue current treatment regimen.  See med list. - 30-day prescription provided to last until patient obtains lab work.   - Counseled patient on pathophysiology of disease and discussed various treatment options, which always includes dietary and lifestyle modification as first line.    - Importance of low carb, heart-healthy diet discussed with patient in addition to regular aerobic exercise of 49mn 5d/week or more.   - Check FBS and 2 hours after the biggest meal of your day.  Keep log and bring in next OV for my review.     - Also told patient if you ever feel poorly, please check your blood pressure and blood sugar, as one or the other could be the cause of your symptoms.  - Pt reminded about need for yearly eye and foot exams.  Told patient to make appt.for diabetic eye exam, CMAs here will do  foot exams  - We will continue to monitor and re-check as discussed.  Hypertension associated with DM - Blood pressure currently stable, at goal. - Patient will continue current treatment regimen.  See med list.  - Counseled patient on pathophysiology of disease and discussed various treatment options, which always includes dietary and lifestyle modification as first line.   - Lifestyle changes such as dash and heart healthy diets and engaging in a regular exercise program discussed extensively with patient.   - Regular ambulatory blood pressure monitoring encouraged at least 3 times weekly.  Keep log and bring in every office visit.  Reminded patient that if they ever feel poorly in any way, to check their blood pressure and pulse.  - We will continue to monitor.  Mixed Diabetic Hyperlipidemia - FLP stable and well-controlled 8 months ago.  - Per patient, often forgets to take her cholesterol medication at night.  States she does take it when she remembers; "maybe three times a week, I remember it."  - Encouraged patient to take her statin nightly. - Pt will continue current treatment regimen.  See med list.  - Prudent dietary changes such as low saturated & trans fat diets for hyperlipidemia and low carb diets for hypertriglyceridemia discussed with patient.    - Encouraged patient to follow AHA guidelines for regular exercise and also engage in weight loss if BMI above 25.   - We will continue to monitor and re-check as discussed.  Vitamin D Deficiency - Stable last check in May of 2020. - Per patient, has not been taking supplementation as prescribed. - Encouraged patient to resume one tablet weekly as prescribed.  - Education provided to patient today regarding prudent supplementation.  - Will continue to monitor and re-check as discussed.  Elevated TSH - Stable at this time. - Continue management as established. - Will continue to monitor and re-check as  discussed.  Obesity assoc with DM, HTN, Chol, hypothyroidism, vit D def etc - Advised patient to continue working toward exercising and weight loss to improve overall mental, physical, and emotional health.    -  Healthy dietary habits encouraged, including low-carb, and high amounts of lean protein in diet.   - Patient should also consume adequate amounts of water.  - Health counseling performed.  All questions answered.  Recommendations - Advised patient to make a follow-up visit as discussed. - Need for full fasting lab work near future.  - Told patient to go home and double check her medications and dosages and update her med list as advised.  - Patient is due for yearly CPE.   - As part of my medical decision making, I reviewed the following data within the [place] History obtained from pt /family, CMA notes reviewed and incorporated if applicable, Labs reviewed, Radiograph/ tests reviewed if applicable and OV notes from prior OV's with me, as well as other specialists she/he has seen since seeing me last, were all reviewed and used in my medical decision making process today.    - Additionally, discussion had with patient regarding our treatment plan, and their biases/concerns about that plan were used in my medical decision making today.    - The patient agreed with the plan and demonstrated an understanding of the instructions.   No barriers to understanding were identified.    - Red flag symptoms and signs discussed in detail.  Patient expressed understanding regarding what to do in case of emergency\ urgent symptoms.   - The patient was advised to call back or seek an in-person evaluation if the symptoms worsen or if the condition fails to improve as anticipated.   Return for yearly CPE and full fasting lab work near future- told NO RFs until F/up OV.     Meds ordered this encounter  Medications  . amLODipine (NORVASC) 5 MG tablet    Sig: Take 0.5  tablets (2.5 mg total) by mouth daily. Needs appt for RF    Dispense:  45 tablet    Refill:  0  . atorvastatin (LIPITOR) 20 MG tablet    Sig: Take 1 tablet (20 mg total) by mouth at bedtime. Needs appt for RF    Dispense:  90 tablet    Refill:  0  . levothyroxine (SYNTHROID) 25 MCG tablet    Sig: Take 0.5 tablets (12.5 mcg total) by mouth daily before breakfast. Needs appt for RF    Dispense:  45 tablet    Refill:  0  . losartan (COZAAR) 100 MG tablet    Sig: Take 0.5 tablets (50 mg total) by mouth daily. Needs appt for RF    Dispense:  45 tablet    Refill:  0  . metFORMIN (GLUCOPHAGE) 500 MG tablet    Sig: Take 0.5 tablets (250 mg total) by mouth 2 (two) times daily with a meal. Needs appt for RF    Dispense:  90 tablet    Refill:  0    Needs appt for RF    Medications Discontinued During This Encounter  Medication Reason  . levothyroxine (SYNTHROID, LEVOTHROID) 25 MCG tablet Reorder  . metFORMIN (GLUCOPHAGE) 500 MG tablet Reorder  . atorvastatin (LIPITOR) 20 MG tablet Reorder  . amLODipine (NORVASC) 5 MG tablet Reorder  . losartan (COZAAR) 100 MG tablet Reorder     I provided 24 minutes of non face-to-face time during this encounter.  Additional time was spent with charting and coordination of care before and after the actual visit commenced.   Note:  This note was prepared with assistance of Dragon voice recognition software. Occasional wrong-word or sound-a-like substitutions may have  occurred due to the inherent limitations of voice recognition software.  This document serves as a record of services personally performed by Diane Dance, DO. It was created on her behalf by Toni Amend, a trained medical scribe. The creation of this record is based on the scribe's personal observations and the provider's statements to them.   This case required medical decision making of at least moderate complexity. The above documentation has been reviewed to be accurate and was  completed by Marjory Sneddon, D.O.    Patient Care Team    Relationship Specialty Notifications Start End  Diane Dance, DO PCP - General Family Medicine  10/02/18   Everlene Farrier, MD Consulting Physician Obstetrics and Gynecology  10/02/18   Gastroenterology, [place] [place]    12/02/18   [facility], [facility]    12/02/18      -Vitals obtained; medications/ allergies reconciled;  personal medical, social, Sx etc.histories were updated by CMA, reviewed by me and are reflected in chart   Patient Active Problem List   Diagnosis Date Noted  . OBesity assoc with DM, HTN, Chol, hypothyroidism, vit D def etc 01/22/2019  . Diabetes mellitus ([place]) 12/11/2018  . Hypertension associated with diabetes ([place]) 12/11/2018  . Mixed diabetic hyperlipidemia associated with type 2 diabetes mellitus ([place]) 12/11/2018  . FHx: early coronary artery disease 10/02/2018  . Elevated TSH-  with significant fatigue and symptoms 12/11/2018  . Vitamin D deficiency 12/11/2018  . Type 2 diabetes mellitus with other circulatory complications ([place]) 42/68/3419  . Chronic fatigue 12/11/2018  . Family history of diabetes mellitus (DM)- aunt 10/02/2018  . Family history of early death-  mother age 73 with history of RA 10/02/2018  . Family history of rheumatoid arthritis-mom 10/02/2018  . Family history of primary liver cancer 10/02/2018  . Salt craving/  eats a lot of salt 10/02/2018     Current Meds  Medication Sig  . amLODipine (NORVASC) 5 MG tablet Take 0.5 tablets (2.5 mg total) by mouth daily. Needs appt for RF  . atorvastatin (LIPITOR) 20 MG tablet Take 1 tablet (20 mg total) by mouth at bedtime. Needs appt for RF  . blood glucose meter kit and supplies Dispense based on patient and insurance preference. Use to check glucose levels fasting in the AM and 2 hours after largest meal. (FOR ICD-10 E10.9, E11.9).  Marland Kitchen levothyroxine (SYNTHROID) 25 MCG tablet Take 0.5 tablets (12.5 mcg total) by mouth daily before  breakfast. Needs appt for RF  . losartan (COZAAR) 100 MG tablet Take 0.5 tablets (50 mg total) by mouth daily. Needs appt for RF  . metFORMIN (GLUCOPHAGE) 500 MG tablet Take 0.5 tablets (250 mg total) by mouth 2 (two) times daily with a meal. Needs appt for RF  . [DISCONTINUED] amLODipine (NORVASC) 5 MG tablet Take 0.5 tablets (2.5 mg total) by mouth daily. OFFICE VISIT REQUIRED PRIOR TO ANY FURTHER REFILLS  . [DISCONTINUED] losartan (COZAAR) 100 MG tablet Take 0.5 tablets (50 mg total) by mouth daily. OFFICE VISIT REQUIRED PRIOR TO ANY FURTHER REFILLS  . [DISCONTINUED] metFORMIN (GLUCOPHAGE) 500 MG tablet Take 0.5 tablets (250 mg total) by mouth 2 (two) times daily with a meal.     Allergies:  No Known Allergies   ROS:  See above HPI for pertinent positives and negatives   Objective:   Blood pressure 140/74, pulse 78.  (if some vitals are omitted, this means that patient was UNABLE to obtain them even though they were asked to get them prior to OV  today.  They were asked to call us at their earliest convenience with these once obtained. )  General: A & O * 3; sounds in no acute distress; in usual state of health.  Skin: Pt confirms warm and dry extremities and pink fingertips HEENT: Pt confirms lips non-cyanotic Chest: Patient confirms normal chest excursion and movement Respiratory: speaking in full sentences, no conversational dyspnea; patient confirms no use of accessory muscles Psych: insight appears good, mood- appears full

## 2020-01-04 ENCOUNTER — Encounter: Payer: Self-pay | Admitting: Family Medicine

## 2020-01-04 ENCOUNTER — Ambulatory Visit (INDEPENDENT_AMBULATORY_CARE_PROVIDER_SITE_OTHER): Payer: 59 | Admitting: Family Medicine

## 2020-01-04 ENCOUNTER — Other Ambulatory Visit: Payer: Self-pay

## 2020-01-04 VITALS — BP 135/79 | HR 69 | Temp 98.1°F | Resp 10 | Ht 63.0 in | Wt 186.3 lb

## 2020-01-04 DIAGNOSIS — Z1231 Encounter for screening mammogram for malignant neoplasm of breast: Secondary | ICD-10-CM

## 2020-01-04 DIAGNOSIS — R7989 Other specified abnormal findings of blood chemistry: Secondary | ICD-10-CM

## 2020-01-04 DIAGNOSIS — Z23 Encounter for immunization: Secondary | ICD-10-CM

## 2020-01-04 DIAGNOSIS — Z1159 Encounter for screening for other viral diseases: Secondary | ICD-10-CM | POA: Diagnosis not present

## 2020-01-04 DIAGNOSIS — Z0000 Encounter for general adult medical examination without abnormal findings: Secondary | ICD-10-CM | POA: Diagnosis not present

## 2020-01-04 DIAGNOSIS — E559 Vitamin D deficiency, unspecified: Secondary | ICD-10-CM

## 2020-01-04 DIAGNOSIS — I10 Essential (primary) hypertension: Secondary | ICD-10-CM

## 2020-01-04 DIAGNOSIS — Z719 Counseling, unspecified: Secondary | ICD-10-CM | POA: Diagnosis not present

## 2020-01-04 DIAGNOSIS — I152 Hypertension secondary to endocrine disorders: Secondary | ICD-10-CM

## 2020-01-04 DIAGNOSIS — E1169 Type 2 diabetes mellitus with other specified complication: Secondary | ICD-10-CM

## 2020-01-04 DIAGNOSIS — E782 Mixed hyperlipidemia: Secondary | ICD-10-CM

## 2020-01-04 DIAGNOSIS — E1159 Type 2 diabetes mellitus with other circulatory complications: Secondary | ICD-10-CM

## 2020-01-04 NOTE — Patient Instructions (Signed)
Preventive Care for Adults, Female  A healthy lifestyle and preventive care can promote health and wellness. Preventive health guidelines for women include the following key practices.   A routine yearly physical is a good way to check with your health care provider about your health and preventive screening. It is a chance to share any concerns and updates on your health and to receive a thorough exam.   Visit your dentist for a routine exam and preventive care every 6 months. Brush your teeth twice a day and floss once a day. Good oral hygiene prevents tooth decay and gum disease.   The frequency of eye exams is based on your age, health, family medical history, use of contact lenses, and other factors. Follow your health care provider's recommendations for frequency of eye exams.   Eat a healthy diet. Foods like vegetables, fruits, whole grains, low-fat dairy products, and lean protein foods contain the nutrients you need without too many calories. Decrease your intake of foods high in solid fats, added sugars, and salt. Eat the right amount of calories for you.Get information about a proper diet from your health care provider, if necessary.   Regular physical exercise is one of the most important things you can do for your health. Most adults should get at least 150 minutes of moderate-intensity exercise (any activity that increases your heart rate and causes you to sweat) each week. In addition, most adults need muscle-strengthening exercises on 2 or more days a week.   Maintain a healthy weight. The body mass index (BMI) is a screening tool to identify possible weight problems. It provides an estimate of body fat based on height and weight. Your health care provider can find your BMI, and can help you achieve or maintain a healthy weight.For adults 20 years and older:   - A BMI below 18.5 is considered underweight.   - A BMI of 18.5 to 24.9 is normal.   - A BMI of 25 to 29.9 is  considered overweight.   - A BMI of 30 and above is considered obese.   Maintain normal blood lipids and cholesterol levels by exercising and minimizing your intake of trans and saturated fats.  Eat a balanced diet with plenty of fruit and vegetables. Blood tests for lipids and cholesterol should begin at age 20 and be repeated every 5 years minimum.  If your lipid or cholesterol levels are high, you are over 40, or you are at high risk for heart disease, you may need your cholesterol levels checked more frequently.Ongoing high lipid and cholesterol levels should be treated with medicines if diet and exercise are not working.   If you smoke, find out from your health care provider how to quit. If you do not use tobacco, do not start.   Lung cancer screening is recommended for adults aged 55-80 years who are at high risk for developing lung cancer because of a history of smoking. A yearly low-dose CT scan of the lungs is recommended for people who have at least a 30-pack-year history of smoking and are a current smoker or have quit within the past 15 years. A pack year of smoking is smoking an average of 1 pack of cigarettes a day for 1 year (for example: 1 pack a day for 30 years or 2 packs a day for 15 years). Yearly screening should continue until the smoker has stopped smoking for at least 15 years. Yearly screening should be stopped for people who develop a   health problem that would prevent them from having lung cancer treatment.   If you are pregnant, do not drink alcohol. If you are breastfeeding, be very cautious about drinking alcohol. If you are not pregnant and choose to drink alcohol, do not have more than 1 drink per day. One drink is considered to be 12 ounces (355 mL) of beer, 5 ounces (148 mL) of wine, or 1.5 ounces (44 mL) of liquor.   Avoid use of street drugs. Do not share needles with anyone. Ask for help if you need support or instructions about stopping the use of  drugs.   High blood pressure causes heart disease and increases the risk of stroke. Your blood pressure should be checked at least yearly.  Ongoing high blood pressure should be treated with medicines if weight loss and exercise do not work.   If you are 69-55 years old, ask your health care provider if you should take aspirin to prevent strokes.   Diabetes screening involves taking a blood sample to check your fasting blood sugar level. This should be done once every 3 years, after age 38, if you are within normal weight and without risk factors for diabetes. Testing should be considered at a younger age or be carried out more frequently if you are overweight and have at least 1 risk factor for diabetes.   Breast cancer screening is essential preventive care for women. You should practice "breast self-awareness."  This means understanding the normal appearance and feel of your breasts and may include breast self-examination.  Any changes detected, no matter how small, should be reported to a health care provider.  Women in their 80s and 30s should have a clinical breast exam (CBE) by a health care provider as part of a regular health exam every 1 to 3 years.  After age 66, women should have a CBE every year.  Starting at age 1, women should consider having a mammogram (breast X-ray test) every year.  Women who have a family history of breast cancer should talk to their health care provider about genetic screening.  Women at a high risk of breast cancer should talk to their health care providers about having an MRI and a mammogram every year.   -Breast cancer gene (BRCA)-related cancer risk assessment is recommended for women who have family members with BRCA-related cancers. BRCA-related cancers include breast, ovarian, tubal, and peritoneal cancers. Having family members with these cancers may be associated with an increased risk for harmful changes (mutations) in the breast cancer genes BRCA1 and  BRCA2. Results of the assessment will determine the need for genetic counseling and BRCA1 and BRCA2 testing.   The Pap test is a screening test for cervical cancer. A Pap test can show cell changes on the cervix that might become cervical cancer if left untreated. A Pap test is a procedure in which cells are obtained and examined from the lower end of the uterus (cervix).   - Women should have a Pap test starting at age 57.   - Between ages 90 and 70, Pap tests should be repeated every 2 years.   - Beginning at age 63, you should have a Pap test every 3 years as long as the past 3 Pap tests have been normal.   - Some women have medical problems that increase the chance of getting cervical cancer. Talk to your health care provider about these problems. It is especially important to talk to your health care provider if a  new problem develops soon after your last Pap test. In these cases, your health care provider may recommend more frequent screening and Pap tests.   - The above recommendations are the same for women who have or have not gotten the vaccine for human papillomavirus (HPV).   - If you had a hysterectomy for a problem that was not cancer or a condition that could lead to cancer, then you no longer need Pap tests. Even if you no longer need a Pap test, a regular exam is a good idea to make sure no other problems are starting.   - If you are between ages 36 and 66 years, and you have had normal Pap tests going back 10 years, you no longer need Pap tests. Even if you no longer need a Pap test, a regular exam is a good idea to make sure no other problems are starting.   - If you have had past treatment for cervical cancer or a condition that could lead to cancer, you need Pap tests and screening for cancer for at least 20 years after your treatment.   - If Pap tests have been discontinued, risk factors (such as a new sexual partner) need to be reassessed to determine if screening should  be resumed.   - The HPV test is an additional test that may be used for cervical cancer screening. The HPV test looks for the virus that can cause the cell changes on the cervix. The cells collected during the Pap test can be tested for HPV. The HPV test could be used to screen women aged 70 years and older, and should be used in women of any age who have unclear Pap test results. After the age of 67, women should have HPV testing at the same frequency as a Pap test.   Colorectal cancer can be detected and often prevented. Most routine colorectal cancer screening begins at the age of 57 years and continues through age 26 years. However, your health care provider may recommend screening at an earlier age if you have risk factors for colon cancer. On a yearly basis, your health care provider may provide home test kits to check for hidden blood in the stool.  Use of a small camera at the end of a tube, to directly examine the colon (sigmoidoscopy or colonoscopy), can detect the earliest forms of colorectal cancer. Talk to your health care provider about this at age 23, when routine screening begins. Direct exam of the colon should be repeated every 5 -10 years through age 49 years, unless early forms of pre-cancerous polyps or small growths are found.   People who are at an increased risk for hepatitis B should be screened for this virus. You are considered at high risk for hepatitis B if:  -You were born in a country where hepatitis B occurs often. Talk with your health care provider about which countries are considered high risk.  - Your parents were born in a high-risk country and you have not received a shot to protect against hepatitis B (hepatitis B vaccine).  - You have HIV or AIDS.  - You use needles to inject street drugs.  - You live with, or have sex with, someone who has Hepatitis B.  - You get hemodialysis treatment.  - You take certain medicines for conditions like cancer, organ  transplantation, and autoimmune conditions.   Hepatitis C blood testing is recommended for all people born from [birth year] through 1965 and any individual  with known risks for hepatitis C.   Practice safe sex. Use condoms and avoid high-risk sexual practices to reduce the spread of sexually transmitted infections (STIs). STIs include gonorrhea, chlamydia, syphilis, trichomonas, herpes, HPV, and human immunodeficiency virus (HIV). Herpes, HIV, and HPV are viral illnesses that have no cure. They can result in disability, cancer, and death. Sexually active women aged 25 years and younger should be checked for chlamydia. Older women with new or multiple partners should also be tested for chlamydia. Testing for other STIs is recommended if you are sexually active and at increased risk.   Osteoporosis is a disease in which the bones lose minerals and strength with aging. This can result in serious bone fractures or breaks. The risk of osteoporosis can be identified using a bone density scan. Women ages 65 years and over and women at risk for fractures or osteoporosis should discuss screening with their health care providers. Ask your health care provider whether you should take a calcium supplement or vitamin D to There are also several preventive steps women can take to avoid osteoporosis and resulting fractures or to keep osteoporosis from worsening. -->Recommendations include:  Eat a balanced diet high in fruits, vegetables, calcium, and vitamins.  Get enough calcium. The recommended total intake of is 1,200 mg daily; for best absorption, if taking supplements, divide doses into 250-500 mg doses throughout the day. Of the two types of calcium, calcium carbonate is best absorbed when taken with food but calcium citrate can be taken on an empty stomach.  Get enough vitamin D. NAMS and the National Osteoporosis Foundation recommend at least 1,000 IU per day for women age 50 and over who are at risk of vitamin D  deficiency. Vitamin D deficiency can be caused by inadequate sun exposure (for example, those who live in northern latitudes).  Avoid alcohol and smoking. Heavy alcohol intake (more than 7 drinks per week) increases the risk of falls and hip fracture and women smokers tend to lose bone more rapidly and have lower bone mass than nonsmokers. Stopping smoking is one of the most important changes women can make to improve their health and decrease risk for disease.  Be physically active every day. Weight-bearing exercise (for example, fast walking, hiking, jogging, and weight training) may strengthen bones or slow the rate of bone loss that comes with aging. Balancing and muscle-strengthening exercises can reduce the risk of falling and fracture.  Consider therapeutic medications. Currently, several types of effective drugs are available. Healthcare providers can recommend the type most appropriate for each woman.  Eliminate environmental factors that may contribute to accidents. Falls cause nearly 90% of all osteoporotic fractures, so reducing this risk is an important bone-health strategy. Measures include ample lighting, removing obstructions to walking, using nonskid rugs on floors, and placing mats and/or grab bars in showers.  Be aware of medication side effects. Some common medicines make bones weaker. These include a type of steroid drug called glucocorticoids used for arthritis and asthma, some antiseizure drugs, certain sleeping pills, treatments for endometriosis, and some cancer drugs. An overactive thyroid gland or using too much thyroid hormone for an underactive thyroid can also be a problem. If you are taking these medicines, talk to your doctor about what you can do to help protect your bones.reduce the rate of osteoporosis.    Menopause can be associated with physical symptoms and risks. Hormone replacement therapy is available to decrease symptoms and risks. You should talk to your  health care provider   about whether hormone replacement therapy is right for you.   Use sunscreen. Apply sunscreen liberally and repeatedly throughout the day. You should seek shade when your shadow is shorter than you. Protect yourself by wearing long sleeves, pants, a wide-brimmed hat, and sunglasses year round, whenever you are outdoors.   Once a month, do a whole body skin exam, using a mirror to look at the skin on your back. Tell your health care provider of new moles, moles that have irregular borders, moles that are larger than a pencil eraser, or moles that have changed in shape or color.   -Stay current with required vaccines (immunizations).   Influenza vaccine. All adults should be immunized every year.  Tetanus, diphtheria, and acellular pertussis (Td, Tdap) vaccine. Pregnant women should receive 1 dose of Tdap vaccine during each pregnancy. The dose should be obtained regardless of the length of time since the last dose. Immunization is preferred during the 27th 36th week of gestation. An adult who has not previously received Tdap or who does not know her vaccine status should receive 1 dose of Tdap. This initial dose should be followed by tetanus and diphtheria toxoids (Td) booster doses every 10 years. Adults with an unknown or incomplete history of completing a 3-dose immunization series with Td-containing vaccines should begin or complete a primary immunization series including a Tdap dose. Adults should receive a Td booster every 10 years.  Varicella vaccine. An adult without evidence of immunity to varicella should receive 2 doses or a second dose if she has previously received 1 dose. Pregnant females who do not have evidence of immunity should receive the first dose after pregnancy. This first dose should be obtained before leaving the health care facility. The second dose should be obtained 4 8 weeks after the first dose.  Human papillomavirus (HPV) vaccine. Females aged 13 26  years who have not received the vaccine previously should obtain the 3-dose series. The vaccine is not recommended for use in pregnant females. However, pregnancy testing is not needed before receiving a dose. If a female is found to be pregnant after receiving a dose, no treatment is needed. In that case, the remaining doses should be delayed until after the pregnancy. Immunization is recommended for any person with an immunocompromised condition through the age of 26 years if she did not get any or all doses earlier. During the 3-dose series, the second dose should be obtained 4 8 weeks after the first dose. The third dose should be obtained 24 weeks after the first dose and 16 weeks after the second dose.  Zoster vaccine. One dose is recommended for adults aged 60 years or older unless certain conditions are present.  Measles, mumps, and rubella (MMR) vaccine. Adults born before 1957 generally are considered immune to measles and mumps. Adults born in 1957 or later should have 1 or more doses of MMR vaccine unless there is a contraindication to the vaccine or there is laboratory evidence of immunity to each of the three diseases. A routine second dose of MMR vaccine should be obtained at least 28 days after the first dose for students attending postsecondary schools, health care workers, or international travelers. People who received inactivated measles vaccine or an unknown type of measles vaccine during 1963 1967 should receive 2 doses of MMR vaccine. People who received inactivated mumps vaccine or an unknown type of mumps vaccine before 1979 and are at high risk for mumps infection should consider immunization with 2 doses of   MMR vaccine. For females of childbearing age, rubella immunity should be determined. If there is no evidence of immunity, females who are not pregnant should be vaccinated. If there is no evidence of immunity, females who are pregnant should delay immunization until after pregnancy.  Unvaccinated health care workers born before [birth year] who lack laboratory evidence of measles, mumps, or rubella immunity or laboratory confirmation of disease should consider measles and mumps immunization with 2 doses of MMR vaccine or rubella immunization with 1 dose of MMR vaccine.  Pneumococcal 13-valent conjugate (PCV13) vaccine. When indicated, a person who is uncertain of her immunization history and has no record of immunization should receive the PCV13 vaccine. An adult aged 54 years or older who has certain medical conditions and has not been previously immunized should receive 1 dose of PCV13 vaccine. This PCV13 should be followed with a dose of pneumococcal polysaccharide (PPSV23) vaccine. The PPSV23 vaccine dose should be obtained at least 8 weeks after the dose of PCV13 vaccine. An adult aged 58 years or older who has certain medical conditions and previously received 1 or more doses of PPSV23 vaccine should receive 1 dose of PCV13. The PCV13 vaccine dose should be obtained 1 or more years after the last PPSV23 vaccine dose.  Pneumococcal polysaccharide (PPSV23) vaccine. When PCV13 is also indicated, PCV13 should be obtained first. All adults aged 58 years and older should be immunized. An adult younger than age 65 years who has certain medical conditions should be immunized. Any person who resides in a nursing home or long-term care facility should be immunized. An adult smoker should be immunized. People with an immunocompromised condition and certain other conditions should receive both PCV13 and PPSV23 vaccines. People with human immunodeficiency virus (HIV) infection should be immunized as soon as possible after diagnosis. Immunization during chemotherapy or radiation therapy should be avoided. Routine use of PPSV23 vaccine is not recommended for American Indians, [place] Natives, or people younger than 65 years unless there are medical conditions that require PPSV23 vaccine. When indicated,  people who have unknown immunization and have no record of immunization should receive PPSV23 vaccine. One-time revaccination 5 years after the first dose of PPSV23 is recommended for people aged 70 64 years who have chronic kidney failure, nephrotic syndrome, asplenia, or immunocompromised conditions. People who received 1 2 doses of PPSV23 before age 32 years should receive another dose of PPSV23 vaccine at age 96 years or later if at least 5 years have passed since the previous dose. Doses of PPSV23 are not needed for people immunized with PPSV23 at or after age 55 years.  Meningococcal vaccine. Adults with asplenia or persistent complement component deficiencies should receive 2 doses of quadrivalent meningococcal conjugate (MenACWY-D) vaccine. The doses should be obtained at least 2 months apart. Microbiologists working with certain meningococcal bacteria, [employer] recruits, people at risk during an outbreak, and people who travel to or live in countries with a high rate of meningitis should be immunized. A first-year college student up through age 58 years who is living in a residence hall should receive a dose if she did not receive a dose on or after her 16th birthday. Adults who have certain high-risk conditions should receive one or more doses of vaccine.  Hepatitis A vaccine. Adults who wish to be protected from this disease, have certain high-risk conditions, work with hepatitis A-infected animals, work in hepatitis A research labs, or travel to or work in countries with a high rate of hepatitis A should be  immunized. Adults who were previously unvaccinated and who anticipate close contact with an international adoptee during the first 60 days after arrival in the [place] from a country with a high rate of hepatitis A should be immunized.  Hepatitis B vaccine.  Adults who wish to be protected from this disease, have certain high-risk conditions, may be exposed to blood or other infectious  body fluids, are household contacts or sex partners of hepatitis B positive people, are clients or workers in certain care facilities, or travel to or work in countries with a high rate of hepatitis B should be immunized.  Haemophilus influenzae type b (Hib) vaccine. A previously unvaccinated person with asplenia or sickle cell disease or having a scheduled splenectomy should receive 1 dose of Hib vaccine. Regardless of previous immunization, a recipient of a hematopoietic stem cell transplant should receive a 3-dose series 6 12 months after her successful transplant. Hib vaccine is not recommended for adults with HIV infection.  Preventive Services / Frequency Ages 6 to 39years  Blood pressure check.** / Every 1 to 2 years.  Lipid and cholesterol check.** / Every 5 years beginning at age 39.  Clinical breast exam.** / Every 3 years for women in their 61s and 62s.  BRCA-related cancer risk assessment.** / For women who have family members with a BRCA-related cancer (breast, ovarian, tubal, or peritoneal cancers).  Pap test.** / Every 2 years from ages 47 through 85. Every 3 years starting at age 34 through age 12 or 74 with a history of 3 consecutive normal Pap tests.  HPV screening.** / Every 3 years from ages 46 through ages 43 to 54 with a history of 3 consecutive normal Pap tests.  Hepatitis C blood test.** / For any individual with known risks for hepatitis C.  Skin self-exam. / Monthly.  Influenza vaccine. / Every year.  Tetanus, diphtheria, and acellular pertussis (Tdap, Td) vaccine.** / Consult your health care provider. Pregnant women should receive 1 dose of Tdap vaccine during each pregnancy. 1 dose of Td every 10 years.  Varicella vaccine.** / Consult your health care provider. Pregnant females who do not have evidence of immunity should receive the first dose after pregnancy.  HPV vaccine. / 3 doses over 6 months, if 64 and younger. The vaccine is not recommended for use in  pregnant females. However, pregnancy testing is not needed before receiving a dose.  Measles, mumps, rubella (MMR) vaccine.** / You need at least 1 dose of MMR if you were born in 1957 or later. You may also need a 2nd dose. For females of childbearing age, rubella immunity should be determined. If there is no evidence of immunity, females who are not pregnant should be vaccinated. If there is no evidence of immunity, females who are pregnant should delay immunization until after pregnancy.  Pneumococcal 13-valent conjugate (PCV13) vaccine.** / Consult your health care provider.  Pneumococcal polysaccharide (PPSV23) vaccine.** / 1 to 2 doses if you smoke cigarettes or if you have certain conditions.  Meningococcal vaccine.** / 1 dose if you are age 71 to 35 years and a Market researcher living in a residence hall, or have one of several medical conditions, you need to get vaccinated against meningococcal disease. You may also need additional booster doses.  Hepatitis A vaccine.** / Consult your health care provider.  Hepatitis B vaccine.** / Consult your health care provider.  Haemophilus influenzae type b (Hib) vaccine.** / Consult your health care provider.  Ages 55 to 64years  Blood pressure check.** / Every 1 to 2 years.  Lipid and cholesterol check.** / Every 5 years beginning at age 20 years.  Lung cancer screening. / Every year if you are aged 55 80 years and have a 30-pack-year history of smoking and currently smoke or have quit within the past 15 years. Yearly screening is stopped once you have quit smoking for at least 15 years or develop a health problem that would prevent you from having lung cancer treatment.  Clinical breast exam.** / Every year after age 40 years.  BRCA-related cancer risk assessment.** / For women who have family members with a BRCA-related cancer (breast, ovarian, tubal, or peritoneal cancers).  Mammogram.** / Every year beginning at age 40  years and continuing for as long as you are in good health. Consult with your health care provider.  Pap test.** / Every 3 years starting at age 30 years through age 65 or 70 years with a history of 3 consecutive normal Pap tests.  HPV screening.** / Every 3 years from ages 30 years through ages 65 to 70 years with a history of 3 consecutive normal Pap tests.  Fecal occult blood test (FOBT) of stool. / Every year beginning at age 50 years and continuing until age 75 years. You may not need to do this test if you get a colonoscopy every 10 years.  Flexible sigmoidoscopy or colonoscopy.** / Every 5 years for a flexible sigmoidoscopy or every 10 years for a colonoscopy beginning at age 50 years and continuing until age 75 years.  Hepatitis C blood test.** / For all people born from 1945 through 1965 and any individual with known risks for hepatitis C.  Skin self-exam. / Monthly.  Influenza vaccine. / Every year.  Tetanus, diphtheria, and acellular pertussis (Tdap/Td) vaccine.** / Consult your health care provider. Pregnant women should receive 1 dose of Tdap vaccine during each pregnancy. 1 dose of Td every 10 years.  Varicella vaccine.** / Consult your health care provider. Pregnant females who do not have evidence of immunity should receive the first dose after pregnancy.  Zoster vaccine.** / 1 dose for adults aged 60 years or older.  Measles, mumps, rubella (MMR) vaccine.** / You need at least 1 dose of MMR if you were born in 1957 or later. You may also need a 2nd dose. For females of childbearing age, rubella immunity should be determined. If there is no evidence of immunity, females who are not pregnant should be vaccinated. If there is no evidence of immunity, females who are pregnant should delay immunization until after pregnancy.  Pneumococcal 13-valent conjugate (PCV13) vaccine.** / Consult your health care provider.  Pneumococcal polysaccharide (PPSV23) vaccine.** / 1 to 2 doses if  you smoke cigarettes or if you have certain conditions.  Meningococcal vaccine.** / Consult your health care provider.  Hepatitis A vaccine.** / Consult your health care provider.  Hepatitis B vaccine.** / Consult your health care provider.  Haemophilus influenzae type b (Hib) vaccine.** / Consult your health care provider.  Ages 65 years and over  Blood pressure check.** / Every 1 to 2 years.  Lipid and cholesterol check.** / Every 5 years beginning at age 20 years.  Lung cancer screening. / Every year if you are aged 55 80 years and have a 30-pack-year history of smoking and currently smoke or have quit within the past 15 years. Yearly screening is stopped once you have quit smoking for at least 15 years or develop a health problem that   would prevent you from having lung cancer treatment.  Clinical breast exam.** / Every year after age 103 years.  BRCA-related cancer risk assessment.** / For women who have family members with a BRCA-related cancer (breast, ovarian, tubal, or peritoneal cancers).  Mammogram.** / Every year beginning at age 36 years and continuing for as long as you are in good health. Consult with your health care provider.  Pap test.** / Every 3 years starting at age 5 years through age 85 or 10 years with 3 consecutive normal Pap tests. Testing can be stopped between 65 and 70 years with 3 consecutive normal Pap tests and no abnormal Pap or HPV tests in the past 10 years.  HPV screening.** / Every 3 years from ages 93 years through ages 70 or 45 years with a history of 3 consecutive normal Pap tests. Testing can be stopped between 65 and 70 years with 3 consecutive normal Pap tests and no abnormal Pap or HPV tests in the past 10 years.  Fecal occult blood test (FOBT) of stool. / Every year beginning at age 8 years and continuing until age 45 years. You may not need to do this test if you get a colonoscopy every 10 years.  Flexible sigmoidoscopy or colonoscopy.** /  Every 5 years for a flexible sigmoidoscopy or every 10 years for a colonoscopy beginning at age 69 years and continuing until age 68 years.  Hepatitis C blood test.** / For all people born from [birth year] through 1967 and any individual with known risks for hepatitis C.  Osteoporosis screening.** / A one-time screening for women ages 7 years and over and women at risk for fractures or osteoporosis.  Skin self-exam. / Monthly.  Influenza vaccine. / Every year.  Tetanus, diphtheria, and acellular pertussis (Tdap/Td) vaccine.** / 1 dose of Td every 10 years.  Varicella vaccine.** / Consult your health care provider.  Zoster vaccine.** / 1 dose for adults aged 5 years or older.  Pneumococcal 13-valent conjugate (PCV13) vaccine.** / Consult your health care provider.  Pneumococcal polysaccharide (PPSV23) vaccine.** / 1 dose for all adults aged 74 years and older.  Meningococcal vaccine.** / Consult your health care provider.  Hepatitis A vaccine.** / Consult your health care provider.  Hepatitis B vaccine.** / Consult your health care provider.  Haemophilus influenzae type b (Hib) vaccine.** / Consult your health care provider. ** Family history and personal history of risk and conditions may change your health care provider's recommendations. Document Released: 12/31/2001 Document Revised: 08/25/2013  [facility] Patient Information 2014 [place], [place].   EXERCISE AND DIET:  We recommended that you start or continue a regular exercise program for good health. Regular exercise means any activity that makes your heart beat faster and makes you sweat.  We recommend exercising at least 30 minutes per day at least 3 days a week, preferably 5.  We also recommend a diet low in fat and sugar / carbohydrates.  Inactivity, poor dietary choices and obesity can cause diabetes, heart attack, stroke, and kidney damage, among others.     ALCOHOL AND SMOKING:  Women should limit their alcohol intake to no  more than 7 drinks/beers/glasses of wine (combined, not each!) per week. Moderation of alcohol intake to this level decreases your risk of breast cancer and liver damage.  ( And of course, no recreational drugs are part of a healthy lifestyle.)  Also, you should not be smoking at all or even being exposed to second hand smoke. Most people know smoking can  cause cancer, and various heart and lung diseases, but did you know it also contributes to weakening of your bones?  Aging of your skin?  Yellowing of your teeth and nails?   CALCIUM AND VITAMIN D:  Adequate intake of calcium and Vitamin D are recommended.  The recommendations for exact amounts of these supplements seem to change often, but generally speaking 600 mg of calcium (either carbonate or citrate) and 800 units of Vitamin D per day seems prudent. Certain women may benefit from higher intake of Vitamin D.  If you are among these women, your doctor will have told you during your visit.     PAP SMEARS:  Pap smears, to check for cervical cancer or precancers,  have traditionally been done yearly, although recent scientific advances have shown that most women can have pap smears less often.  However, every woman still should have a physical exam from her gynecologist or primary care physician every year. It will include a breast check, inspection of the vulva and vagina to check for abnormal growths or skin changes, a visual exam of the cervix, and then an exam to evaluate the size and shape of the uterus and ovaries.  And after 61 years of age, a rectal exam is indicated to check for rectal cancers. We will also provide age appropriate advice regarding health maintenance, like when you should have certain vaccines, screening for sexually transmitted diseases, bone density testing, colonoscopy, mammograms, etc.    MAMMOGRAMS:  All women over 71 years old should have a yearly mammogram. Many facilities now offer a "3D" mammogram, which may cost  around $50 extra out of pocket. If possible,  we recommend you accept the option to have the 3D mammogram performed.  It both reduces the number of women who will be called back for extra views which then turn out to be normal, and it is better than the routine mammogram at detecting truly abnormal areas.     COLONOSCOPY:  Colonoscopy to screen for colon cancer is recommended for all women at age 52.  We know, you hate the idea of the prep.  We agree, BUT, having colon cancer and not knowing it is worse!!  Colon cancer so often starts as a polyp that can be seen and removed at colonscopy, which can quite literally save your life!  And if your first colonoscopy is normal and you have no family history of colon cancer, most women don't have to have it again for 10 years.  Once every ten years, you can do something that may end up saving your life, right?  We will be happy to help you get it scheduled when you are ready.  Be sure to check your insurance coverage so you understand how much it will cost.  It may be covered as a preventative service at no cost, but you should check your particular policy.

## 2020-01-04 NOTE — Progress Notes (Signed)
Female Physical  Impression and Recommendations:    1. Hypertension associated with diabetes ([place])   2. Type 2 diabetes mellitus with other circulatory complications ([name])   3. Type 2 diabetes mellitus with other specified complication, without long-term current use of insulin ([place])   4. Mixed diabetic hyperlipidemia associated with type 2 diabetes mellitus ([name])   5. Elevated TSH-  with significant fatigue and symptoms   6. Vitamin D deficiency   7. OBesity assoc with DM, HTN, Chol, hypothyroidism, vit D def etc   8. Benign essential HTN   9. Encounter for hepatitis C screening test for low risk patient   10. Encounter for screening mammogram for malignant neoplasm of breast   11. Need for Tdap vaccination     1) Anticipatory Guidance: Discussed importance of wearing a seatbelt while driving, not texting while driving; sunscreen when outside along with yearly skin surveillance; eating a well balanced and modest diet; physical activity at least 25 minutes per day or 150 min/ week of moderate to intense activity.  - Encouraged patient to engage in prudent skin surveillance habits.  2) Immunizations / Screenings / Labs:   All immunizations and screenings that patient agrees to, are up-to-date per recommendations or will be updated today.  Patient understands the needs for q 43modental and yearly vision screens which pt will schedule independently. Obtain CBC, CMP, HgA1c, Lipid panel, TSH and vit D when fasting if not already done recently.   - Last colonoscopy obtained 01/15/2016, repeat in 2022.  - Need for repeat mammogram; last obtained 6 of 2018.  Patient prefers to [facility] to [facility] - If patient has not heard about order within a week, she knows to call [facility]  - Patient will obtain repeat pap smear with Dr. TGaetano Net - Patient knows to call GYN for follow-up as advised.  - TDAP up to date.  - Shingles up to date.  - Need for diabetic  foot exam.  - Need for diabetic urine sample.  - Need for full fasting lab work.  See orders.  3) Weight - Body mass index is 33 kg/m: Discussed goal of losing even 5-10% of current body weight which would improve overall feelings of well being and improve objective health data significantly.   Improve nutrient density of diet through increasing intake of fruits and vegetables and decreasing saturated/trans fats, white flour products and refined sugar products.   4) Lifestyle & Preventative Health Maintenance: - Advised patient to continue working toward exercising and prudent weight loss to improve overall mental, physical, and emotional health.    - Reviewed the "spokes of the wheel" of mood and health management.  Stressed the importance of ongoing prudent habits, including regular exercise, appropriate sleep hygiene, healthful dietary habits, and prayer/meditation to relax.  - Encouraged patient to engage in daily physical activity as tolerated, especially a formal exercise routine.  Recommended that the patient eventually strive for at least 150 minutes of moderate cardiovascular activity per week according to guidelines established by the [facility]   - Healthy dietary habits encouraged, including low-carb, and high amounts of lean protein in diet.   - Patient should also consume adequate amounts of water.  - Health counseling performed.  All questions answered.  5) COVID-19 Counseling: - COVID-19 counseling provided. - Education provided to patient and all questions answered. - Discussed importance of checking [place] [facility] and [place] for further information and guidelines  regarding vaccinations. - Patient knows to call clinic if she has further questions.  Recommendations - Return to review lab work if desired. - Patient wishes to follow up to review labs if need to start new meds. - Otherwise, return in 3-4 months for chronic f/up.    No orders of the  defined types were placed in this encounter.   Orders Placed This Encounter  Procedures   MM Digital Screening   Tdap vaccine greater than or equal to 7yo IM   Comprehensive metabolic panel   CBC   TSH   T4, free   Hemoglobin A1c   Lipid panel   VITAMIN D 25 Hydroxy (Vit-D Deficiency, Fractures)   Urine Microalbumin w/creat. ratio   Hepatitis C Antibody     Return for f/up near future to review lab work if desired, televisit otherwise explained to patient she will need to follow-up every 3 to 4 months for her diabetes, hypertension, hyperlipidemia, obesity etc.   Reminded pt important of f-up preventative CPE in 1 year.  Reminded pt again, this is in addition to any chronic care visits.    Gross side effects, risk and benefits, and alternatives of medications discussed with patient.  Patient is aware that all medications have potential side effects and we are unable to predict every side effect or drug-drug interaction that may occur.  Expresses verbal understanding and consents to current therapy plan and treatment regimen.  F-up preventative CPE in 1 year- reminded pt again, this is in addition to any chronic care visits.    Please see orders placed and AVS handed out to patient at the end of our visit for further patient instructions/ counseling done pertaining to today's office visit.   This document serves as a record of services personally performed by Mellody Dance, DO. It was created on her behalf by Toni Amend, a trained medical scribe. The creation of this record is based on the scribe's personal observations and the provider's statements to them.   This case required medical decision making of at least moderate complexity.  This case required medical decision making of at least moderate complexity. The above documentation from Toni Amend, medical scribe, has been reviewed by Marjory Sneddon, D.O.      Subjective:    I, Toni Amend, am serving as Education administrator for [employer].   CPE HPI: Diane Carter is a 59 y.o. female who presents to [place] [facility] today a yearly health maintenance exam.   Health Maintenance Summary  - Reviewed and updated, unless pt declines services.  Last Cologuard or Colonoscopy:  Last obtained 01/15/2016.  She had polyp removed by Dr. Penelope Coop of [facility].  I do not have the pathology report stating when she should recall this test.  Told patient to call [facility] and ask if this was repeat in 5 years or 10 since she cannot recall exactly.  Luckily 5-year repeat would be 2022. Family history of Colon CA:  Denies family history of colon cancer.  Tobacco History Reviewed:  Y; never smoker.  Alcohol and/or drug use:    No concerns; no use Exercise Habits:   Not meeting AHA guidelines; notes exercising "none." Dental Home:  Goes to the dentist every six months. DM Eye exams:  Believes her last exam was in March of 2020; goes for eye exams every other year. Dermatology home:  Denies skin concerns; hasn't noticed any issues.  Female Health:  Follows up with Dr. Gaetano Net;  notes "I haven't been there in a while" due to COVID-19. PAP Smear:  no h/o ABN; per patient sees Dr. Gertie Fey of physician for women[facility] regularly.   I was told our office requested those results today STD concerns:   none, monogamous Birth control method:  Post-menopausal. Menses regular:  Post-menopausal. Lumps or breast concerns:  Last mammogram done 6 of 2018; no concerns reported.  Patient understands this should be done yearly. Breast Cancer Family History:  None reported.   Additional concerns beyond health maintenance issues:  Believes she's sleeping well.  Emotionally feels well overall.  Has no concerns about stress management; usually just "gets things off her chest."  Denies constipation, diarrhea, GI concerns.  History of obesity, diabetes, hypertension, hyperlipidemia:  Notes she is eating  "nothing healthy."     Says at first when her daughter came home during [place], they were doing good, walking, eating healthfully, but now they are not doing as well as she would like.  Notes "we do eat a lot of pasta."   She tells me it has been a little more stressful in the house having her daughter there and not having her own space etc. But they are managing ok.  patient notes she just started taking zinc, self-directed, to help her immune system during the pandemic.     Immunization History  Administered Date(s) Administered   Tdap 01/04/2020   Zoster Recombinat (Shingrix) 02/07/2019, 09/23/2019     Health Maintenance  Topic Date Due   Hepatitis C Screening  09/12/1961   OPHTHALMOLOGY EXAM  07/06/1969   HIV Screening  07/06/1974   MAMMOGRAM  04/30/2019   INFLUENZA VACCINE  06/19/2019   HEMOGLOBIN A1C  10/02/2019   PNEUMOCOCCAL POLYSACCHARIDE VACCINE AGE 20-64 HIGH RISK  01/22/2020 (Originally 07/06/1963)   FOOT EXAM  01/22/2020   PAP SMEAR-Modifier  06/03/2020   COLONOSCOPY  01/14/2026   TETANUS/TDAP  01/03/2030     Wt Readings from Last 3 Encounters:  01/04/20 186 lb 4.8 oz (84.5 kg)  04/15/19 174 lb (78.9 kg)  01/22/19 179 lb (81.2 kg)   BP Readings from Last 3 Encounters:  01/04/20 135/79  11/26/19 140/74  04/15/19 123/63   Pulse Readings from Last 3 Encounters:  01/04/20 69  11/26/19 78  04/15/19 70     History reviewed. No pertinent past medical history.    Past Surgical History:  Procedure Laterality Date   CESAREAN SECTION  1995   LIVER RESECTION  1992      Family History  Problem Relation Age of Onset   Liver cancer Father    Alcohol abuse Father    Diabetes type I Paternal Aunt    Heart attack Other       Social History   Substance and Sexual Activity  Drug Use Never  ,   Social History   Substance and Sexual Activity  Alcohol Use Never  ,   Social History   Tobacco Use  Smoking Status Never Smoker    Smokeless Tobacco Never Used  ,   Social History   Substance and Sexual Activity  Sexual Activity Yes   Partners: Male   Birth control/protection: None    Current Outpatient Medications on File Prior to Visit  Medication Sig Dispense Refill   amLODipine (NORVASC) 5 MG tablet Take 0.5 tablets (2.5 mg total) by mouth daily. Needs appt for RF 45 tablet 0   atorvastatin (LIPITOR) 20 MG tablet Take 1 tablet (20 mg total) by mouth at bedtime. Needs  appt for RF 90 tablet 0   blood glucose meter kit and supplies Dispense based on patient and insurance preference. Use to check glucose levels fasting in the AM and 2 hours after largest meal. (FOR ICD-10 E10.9, E11.9). 1 each 0   levothyroxine (SYNTHROID) 25 MCG tablet Take 0.5 tablets (12.5 mcg total) by mouth daily before breakfast. Needs appt for RF 45 tablet 0   losartan (COZAAR) 100 MG tablet Take 0.5 tablets (50 mg total) by mouth daily. Needs appt for RF 45 tablet 0   metFORMIN (GLUCOPHAGE) 500 MG tablet Take 0.5 tablets (250 mg total) by mouth 2 (two) times daily with a meal. Needs appt for RF 90 tablet 0   Vitamin D, Ergocalciferol, (DRISDOL) 1.25 MG (50000 UT) CAPS capsule One tab weekly 12 capsule 3   No current facility-administered medications on file prior to visit.    Allergies: Patient has no known allergies.   Review of Systems: General:   Denies fever, chills, unexplained weight loss.  Optho/Auditory:   Denies visual changes, blurred vision/LOV Respiratory:   Denies SOB, DOE more than baseline levels.   Cardiovascular:   Denies chest pain, palpitations, new onset peripheral edema  Gastrointestinal:   Denies nausea, vomiting, diarrhea.  Genitourinary: Denies dysuria, freq/ urgency, flank pain or discharge from genitals.  Endocrine:     Denies hot or cold intolerance, polyuria, polydipsia. Musculoskeletal:   Denies unexplained myalgias, joint swelling, unexplained arthralgias, gait problems.  Skin:  Denies rash,  suspicious lesions Neurological:     Denies dizziness, unexplained weakness, numbness  Psychiatric/Behavioral:   Denies mood changes, suicidal or homicidal ideations, hallucinations    Objective:    Blood pressure 135/79, pulse 69, temperature 98.1 F (36.7 C), temperature source Oral, resp. rate 10, height '5\' 3"'  (1.6 m), weight 186 lb 4.8 oz (84.5 kg), SpO2 99 %. Body mass index is 33 kg/m. General Appearance:    Alert, cooperative, no distress, appears stated age  Head:    Normocephalic, without obvious abnormality, atraumatic  Eyes:    PERRL, conjunctiva/corneas clear, EOM's intact, fundi    benign, both eyes  Ears:    Normal TM's and external ear canals, both ears  Nose:   Nares normal, septum midline, mucosa normal, no drainage    or sinus tenderness  Throat:   Lips w/o lesion, mucosa moist, and tongue normal; teeth and   gums normal  Neck:   Supple, symmetrical, trachea midline, no adenopathy;    thyroid:  no enlargement/tenderness/nodules; no carotid   bruit or JVD  Back:     Symmetric, no curvature, ROM normal, no CVA tenderness  Lungs:     Clear to auscultation bilaterally, respirations unlabored, no       Wh/ R/ R  Chest Wall:    No tenderness or gross deformity; normal excursion   Heart:    Regular rate and rhythm, S1 and S2 normal, no murmur, rub   or gallop  Breast Exam:    Deferred to GYN.  Abdomen:     Soft, non-tender, bowel sounds active all four quadrants, NO   G/R/R, no masses, no organomegaly  Genitalia:    Deferred to GYN.  Rectal:    Deferred by pt;   Extremities:   Extremities normal, atraumatic, no cyanosis or gross edema  Pulses:   2+ and symmetric all extremities  Skin:   Warm, dry, Skin color, texture, turgor normal, no obvious rashes or lesions Psych: No HI/SI, judgement and insight good, Euthymic mood. Full  Affect.  Neurologic:   CNII-XII intact, normal strength, sensation and reflexes    Throughout

## 2020-01-06 LAB — CBC
Hematocrit: 38.2 % (ref 34.0–46.6)
Hemoglobin: 13.1 g/dL (ref 11.1–15.9)
MCH: 28.6 pg (ref 26.6–33.0)
MCHC: 34.3 g/dL (ref 31.5–35.7)
MCV: 83 fL (ref 79–97)
Platelets: 349 10*3/uL (ref 150–450)
RBC: 4.58 x10E6/uL (ref 3.77–5.28)
RDW: 13.1 % (ref 11.7–15.4)
WBC: 7.9 10*3/uL (ref 3.4–10.8)

## 2020-01-06 LAB — COMPREHENSIVE METABOLIC PANEL
ALT: 16 IU/L (ref 0–32)
AST: 20 IU/L (ref 0–40)
Albumin/Globulin Ratio: 1.7 (ref 1.2–2.2)
Albumin: 4.7 g/dL (ref 3.8–4.9)
Alkaline Phosphatase: 129 IU/L — ABNORMAL HIGH (ref 39–117)
BUN/Creatinine Ratio: 12 (ref 12–28)
BUN: 9 mg/dL (ref 8–27)
Bilirubin Total: 0.4 mg/dL (ref 0.0–1.2)
CO2: 25 mmol/L (ref 20–29)
Calcium: 9.6 mg/dL (ref 8.7–10.3)
Chloride: 102 mmol/L (ref 96–106)
Creatinine, Ser: 0.75 mg/dL (ref 0.57–1.00)
GFR calc Af Amer: 100 mL/min/{1.73_m2} (ref >59)
GFR calc non Af Amer: 87 mL/min/{1.73_m2} (ref >59)
Globulin, Total: 2.7 g/dL (ref 1.5–4.5)
Glucose: 102 mg/dL — ABNORMAL HIGH (ref 65–99)
Potassium: 4.3 mmol/L (ref 3.5–5.2)
Sodium: 142 mmol/L (ref 134–144)
Total Protein: 7.4 g/dL (ref 6.0–8.5)

## 2020-01-06 LAB — LIPID PANEL
Chol/HDL Ratio: 4.1 ratio (ref 0.0–4.4)
Cholesterol, Total: 192 mg/dL (ref 100–199)
HDL: 47 mg/dL (ref >39)
LDL Chol Calc (NIH): 115 mg/dL — ABNORMAL HIGH (ref 0–99)
Triglycerides: 170 mg/dL — ABNORMAL HIGH (ref 0–149)
VLDL Cholesterol Cal: 30 mg/dL (ref 5–40)

## 2020-01-06 LAB — HEPATITIS C ANTIBODY: Hep C Virus Ab: <0.1 s/co ratio (ref 0.0–0.9)

## 2020-01-06 LAB — T4, FREE: Free T4: 0.92 ng/dL (ref 0.82–1.77)

## 2020-01-06 LAB — VITAMIN D 25 HYDROXY (VIT D DEFICIENCY, FRACTURES): Vit D, 25-Hydroxy: 35.3 ng/mL (ref 30.0–100.0)

## 2020-01-06 LAB — MICROALBUMIN / CREATININE URINE RATIO
Creatinine, Urine: 88.3 mg/dL
Microalb/Creat Ratio: 14 mg/g creat (ref 0–29)
Microalbumin, Urine: 12 ug/mL

## 2020-01-06 LAB — HEMOGLOBIN A1C
Est. average glucose Bld gHb Est-mCnc: 143 mg/dL
Hgb A1c MFr Bld: 6.6 % — ABNORMAL HIGH (ref 4.8–5.6)

## 2020-01-06 LAB — TSH: TSH: 3.9 u[IU]/mL (ref 0.450–4.500)

## 2020-01-07 ENCOUNTER — Other Ambulatory Visit: Payer: Self-pay | Admitting: Family Medicine

## 2020-01-07 DIAGNOSIS — E1169 Type 2 diabetes mellitus with other specified complication: Secondary | ICD-10-CM

## 2020-01-07 DIAGNOSIS — E782 Mixed hyperlipidemia: Secondary | ICD-10-CM

## 2020-01-07 MED ORDER — ATORVASTATIN CALCIUM 40 MG PO TABS: 40.0000 mg | ORAL_TABLET | Freq: Every day | ORAL | 0 refills | Status: DC

## 2020-01-13 ENCOUNTER — Other Ambulatory Visit: Payer: Self-pay | Admitting: Family Medicine

## 2020-01-13 DIAGNOSIS — E559 Vitamin D deficiency, unspecified: Secondary | ICD-10-CM

## 2020-01-27 ENCOUNTER — Encounter: Payer: Self-pay | Admitting: Family Medicine

## 2020-02-14 ENCOUNTER — Other Ambulatory Visit: Payer: Self-pay | Admitting: Family Medicine

## 2020-02-14 DIAGNOSIS — E559 Vitamin D deficiency, unspecified: Secondary | ICD-10-CM

## 2020-02-15 DIAGNOSIS — D649 Anemia, unspecified: Secondary | ICD-10-CM | POA: Insufficient documentation

## 2020-02-15 DIAGNOSIS — G43909 Migraine, unspecified, not intractable, without status migrainosus: Secondary | ICD-10-CM | POA: Insufficient documentation

## 2020-02-15 DIAGNOSIS — M8580 Other specified disorders of bone density and structure, unspecified site: Secondary | ICD-10-CM | POA: Insufficient documentation

## 2020-02-15 DIAGNOSIS — R87619 Unspecified abnormal cytological findings in specimens from cervix uteri: Secondary | ICD-10-CM | POA: Insufficient documentation

## 2020-02-24 LAB — HM MAMMOGRAPHY

## 2020-02-25 ENCOUNTER — Other Ambulatory Visit: Payer: Self-pay | Admitting: Family Medicine

## 2020-02-25 DIAGNOSIS — I152 Hypertension secondary to endocrine disorders: Secondary | ICD-10-CM

## 2020-02-25 DIAGNOSIS — E1169 Type 2 diabetes mellitus with other specified complication: Secondary | ICD-10-CM

## 2020-02-25 DIAGNOSIS — E1159 Type 2 diabetes mellitus with other circulatory complications: Secondary | ICD-10-CM

## 2020-03-15 ENCOUNTER — Other Ambulatory Visit: Payer: Self-pay | Admitting: Family Medicine

## 2020-03-15 DIAGNOSIS — E559 Vitamin D deficiency, unspecified: Secondary | ICD-10-CM

## 2020-04-05 ENCOUNTER — Other Ambulatory Visit: Payer: Self-pay | Admitting: Family Medicine

## 2020-04-05 DIAGNOSIS — R7989 Other specified abnormal findings of blood chemistry: Secondary | ICD-10-CM

## 2020-04-05 DIAGNOSIS — E1169 Type 2 diabetes mellitus with other specified complication: Secondary | ICD-10-CM

## 2020-04-05 DIAGNOSIS — E1159 Type 2 diabetes mellitus with other circulatory complications: Secondary | ICD-10-CM

## 2020-06-13 ENCOUNTER — Telehealth: Payer: Self-pay | Admitting: Physician Assistant

## 2020-06-13 DIAGNOSIS — E1169 Type 2 diabetes mellitus with other specified complication: Secondary | ICD-10-CM

## 2020-06-13 DIAGNOSIS — E559 Vitamin D deficiency, unspecified: Secondary | ICD-10-CM

## 2020-06-13 DIAGNOSIS — E1159 Type 2 diabetes mellitus with other circulatory complications: Secondary | ICD-10-CM

## 2020-06-13 MED ORDER — ATORVASTATIN CALCIUM 40 MG PO TABS: 40.0000 mg | ORAL_TABLET | Freq: Every day | ORAL | 0 refills | Status: DC

## 2020-06-13 MED ORDER — METFORMIN HCL 500 MG PO TABS: ORAL_TABLET | ORAL | 0 refills | Status: DC

## 2020-06-13 MED ORDER — VITAMIN D (ERGOCALCIFEROL) 1.25 MG (50000 UNIT) PO CAPS: 50000.0000 [IU] | ORAL_CAPSULE | ORAL | 0 refills | Status: DC

## 2020-06-13 MED ORDER — LOSARTAN POTASSIUM 100 MG PO TABS: 50.0000 mg | ORAL_TABLET | Freq: Every day | ORAL | 0 refills | Status: DC

## 2020-06-13 MED ORDER — AMLODIPINE BESYLATE 5 MG PO TABS: 2.5000 mg | ORAL_TABLET | Freq: Every day | ORAL | 0 refills | Status: DC

## 2020-06-13 NOTE — Telephone Encounter (Signed)
Medication refills sent to requested pharmacy. AS, CMA °

## 2020-06-13 NOTE — Telephone Encounter (Signed)
Patient needs meds called in. Lab and appt already scheduled. The meds are Norvasc, Lipitor, Cozaar, Metformin, and drisdol   [facility] [place] [facility] [address] [employer]

## 2020-06-13 NOTE — Addendum Note (Signed)
Addended by: Sylvester Harder on: 06/13/2020 12:00 PM   Modules accepted: Orders

## 2020-06-14 ENCOUNTER — Other Ambulatory Visit: Payer: Self-pay

## 2020-06-14 ENCOUNTER — Other Ambulatory Visit: Payer: 59

## 2020-06-14 DIAGNOSIS — E782 Mixed hyperlipidemia: Secondary | ICD-10-CM

## 2020-06-14 DIAGNOSIS — E559 Vitamin D deficiency, unspecified: Secondary | ICD-10-CM

## 2020-06-14 DIAGNOSIS — E1159 Type 2 diabetes mellitus with other circulatory complications: Secondary | ICD-10-CM

## 2020-06-14 DIAGNOSIS — E1169 Type 2 diabetes mellitus with other specified complication: Secondary | ICD-10-CM

## 2020-06-15 LAB — LIPID PANEL
Chol/HDL Ratio: 4.6 ratio — ABNORMAL HIGH (ref 0.0–4.4)
Cholesterol, Total: 173 mg/dL (ref 100–199)
HDL: 38 mg/dL — ABNORMAL LOW (ref >39)
LDL Chol Calc (NIH): 111 mg/dL — ABNORMAL HIGH (ref 0–99)
Triglycerides: 131 mg/dL (ref 0–149)
VLDL Cholesterol Cal: 24 mg/dL (ref 5–40)

## 2020-06-15 LAB — ALT: ALT: 16 IU/L (ref 0–32)

## 2020-06-15 LAB — VITAMIN D 25 HYDROXY (VIT D DEFICIENCY, FRACTURES): Vit D, 25-Hydroxy: 31.3 ng/mL (ref 30.0–100.0)

## 2020-06-15 LAB — HEMOGLOBIN A1C
Est. average glucose Bld gHb Est-mCnc: 134 mg/dL
Hgb A1c MFr Bld: 6.3 % — ABNORMAL HIGH (ref 4.8–5.6)

## 2020-06-30 ENCOUNTER — Other Ambulatory Visit: Payer: Self-pay | Admitting: Physician Assistant

## 2020-06-30 DIAGNOSIS — E1169 Type 2 diabetes mellitus with other specified complication: Secondary | ICD-10-CM

## 2020-06-30 DIAGNOSIS — E1159 Type 2 diabetes mellitus with other circulatory complications: Secondary | ICD-10-CM

## 2020-07-12 ENCOUNTER — Other Ambulatory Visit: Payer: Self-pay | Admitting: Physician Assistant

## 2020-07-12 DIAGNOSIS — E559 Vitamin D deficiency, unspecified: Secondary | ICD-10-CM

## 2020-07-19 ENCOUNTER — Ambulatory Visit: Payer: 59 | Admitting: Physician Assistant

## 2020-07-19 ENCOUNTER — Other Ambulatory Visit: Payer: Self-pay

## 2020-07-19 ENCOUNTER — Encounter: Payer: Self-pay | Admitting: Physician Assistant

## 2020-07-19 VITALS — BP 124/72 | HR 66 | Ht 63.0 in | Wt 173.9 lb

## 2020-07-19 DIAGNOSIS — I10 Essential (primary) hypertension: Secondary | ICD-10-CM | POA: Diagnosis not present

## 2020-07-19 DIAGNOSIS — E1169 Type 2 diabetes mellitus with other specified complication: Secondary | ICD-10-CM

## 2020-07-19 DIAGNOSIS — I152 Hypertension secondary to endocrine disorders: Secondary | ICD-10-CM

## 2020-07-19 DIAGNOSIS — E782 Mixed hyperlipidemia: Secondary | ICD-10-CM

## 2020-07-19 DIAGNOSIS — E1159 Type 2 diabetes mellitus with other circulatory complications: Secondary | ICD-10-CM | POA: Diagnosis not present

## 2020-07-19 NOTE — Assessment & Plan Note (Signed)
-  Most recent lipid panel improved from prior but LDL remains elevated most likely due to missing doses of Lipitor. -Discussed with patient taking medication in the morning to improve compliance and help reduce bad cholesterol. -Follow a heart healthy diet. -Plan to recheck lipid panel and hepatic function next OV.

## 2020-07-19 NOTE — Assessment & Plan Note (Signed)
-  BP at goal -Continue current medication regimen. -Follow DASH diet. -Continue to stay well-hydrated. -Most recent CMP WNL

## 2020-07-19 NOTE — Progress Notes (Signed)
Established Patient Office Visit  Subjective:  Patient ID: Diane Carter, female    DOB: 12/24/1960  Age: 59 y.o. MRN: [id number]  CC:  Chief Complaint  Patient presents with   Diabetes   Hypertension   Hyperlipidemia    HPI Diane Carter presents for follow-up on diabetes mellitus, hypertension, and hyperlipidemia.  Diabetes: Pt denies increased urination or thirst. Pt reports medication compliance-she is only taking half a tablet of Metformin 500 mg once daily. No hypoglycemic events. Checking glucose at home sometimes.  Reports she started a keto diet early July to help with weight loss.  She has not been as diligent with her walking regimen but plans to start being more active.  HTN: Pt denies chest pain, palpitations, dizziness or lower extremity swelling. Taking medication as directed without side effects. Does not check BP at home. Pt does not really monitor sodium.  Reports good hydration.  HLD: Pt taking medication as directed without issues. Denies side effects including myalgias and RUQ pain. Reports she is having a hard time remembering to take her medication at bedtime and has missed a couple doses.   History reviewed. No pertinent past medical history.  Past Surgical History:  Procedure Laterality Date   CESAREAN SECTION  1995   LIVER RESECTION  1992    Family History  Problem Relation Age of Onset   Liver cancer Father    Alcohol abuse Father    Diabetes type I Paternal Aunt    Heart attack Other     Social History   Socioeconomic History   Marital status: Unknown    Spouse name: Not on file   Number of children: Not on file   Years of education: Not on file   Highest education level: Not on file  Occupational History   Not on file  Tobacco Use   Smoking status: Never Smoker   Smokeless tobacco: Never Used  Vaping Use   Vaping Use: Never used  Substance and Sexual Activity   Alcohol use: Never   Drug use: Never   Sexual  activity: Yes    Partners: Male    Birth control/protection: None  Other Topics Concern   Not on file  Social History Narrative   Not on file   Social Determinants of Health   Financial Resource Strain:    Difficulty of Paying Living Expenses: Not on file  Food Insecurity:    Worried About Charity fundraiser in the Last Year: Not on file   [employer] of Food in the Last Year: Not on file  Transportation Needs:    Lack of Transportation (Medical): Not on file   Lack of Transportation (Non-Medical): Not on file  Physical Activity:    Days of Exercise per Week: Not on file   Minutes of Exercise per Session: Not on file  Stress:    Feeling of Stress : Not on file  Social Connections:    Frequency of Communication with Friends and Family: Not on file   Frequency of Social Gatherings with Friends and Family: Not on file   Attends Religious Services: Not on file   Active Member of Clubs or Organizations: Not on file   Attends Archivist Meetings: Not on file   Marital Status: Not on file  Intimate Partner Violence:    Fear of Current or Ex-Partner: Not on file   Emotionally Abused: Not on file   Physically Abused: Not on file   Sexually Abused: Not on  file    Outpatient Medications Prior to Visit  Medication Sig Dispense Refill   amLODipine (NORVASC) 5 MG tablet TAKE 0.5 TABLETS (2.5 MG TOTAL) BY MOUTH DAILY. NEEDS APPT FOR REFILLS 45 tablet 0   atorvastatin (LIPITOR) 40 MG tablet Take 1 tablet (40 mg total) by mouth at bedtime. Needs labs 4 RF 90 tablet 0   blood glucose meter kit and supplies Dispense based on patient and insurance preference. Use to check glucose levels fasting in the AM and 2 hours after largest meal. (FOR ICD-10 E10.9, E11.9). 1 each 0   levothyroxine (SYNTHROID) 25 MCG tablet Take 0.5 tablets (12.5 mcg total) by mouth daily before breakfast. Needs appt for RF 45 tablet 0   losartan (COZAAR) 100 MG tablet TAKE 1/2 TABLET BY  MOUTH DAILY 45 tablet 0   metFORMIN (GLUCOPHAGE) 500 MG tablet TAKE 1/2 TABLET BY MOUTH TWICE DAILY WITH MEALS 90 tablet 0   Vitamin D, Ergocalciferol, (DRISDOL) 1.25 MG (50000 UNIT) CAPS capsule TAKE 1 CAPSULE (50,000 UNITS TOTAL) BY MOUTH EVERY 7 (SEVEN) DAYS. 4 capsule 1   No facility-administered medications prior to visit.    No Known Allergies  ROS Review of Systems  A fourteen system review of systems was performed and found to be positive as per HPI.   Objective:    Physical Exam General:  Well Developed, well nourished, appropriate for stated age.  Neuro:  Alert and oriented,  extra-ocular muscles intact  HEENT:  Normocephalic, atraumatic, neck supple Skin:  no gross rash, warm, pink. Cardiac:  RRR, S1 S2 Respiratory:  ECTA B/L and A/P, Not using accessory muscles, speaking in full sentences- unlabored. Vascular:  Ext warm, no cyanosis apprec.; cap RF less 2 sec. no gross edema Psych:  No HI/SI, judgement and insight good, Euthymic mood. Full Affect.   BP 124/72    Pulse 66    Ht 5' 3" (1.6 m)    Wt 173 lb 14.4 oz (78.9 kg)    SpO2 99%    BMI 30.81 kg/m  Wt Readings from Last 3 Encounters:  07/19/20 173 lb 14.4 oz (78.9 kg)  01/04/20 186 lb 4.8 oz (84.5 kg)  04/15/19 174 lb (78.9 kg)     Health Maintenance Due  Topic Date Due   PNEUMOCOCCAL POLYSACCHARIDE VACCINE AGE 49-64 HIGH RISK  Never done   COVID-19 Vaccine (1) Never done   HIV Screening  Never done   FOOT EXAM  01/22/2020   OPHTHALMOLOGY EXAM  01/29/2020   INFLUENZA VACCINE  Never done   PAP SMEAR-Modifier  06/03/2020    There are no preventive care reminders to display for this patient.  Lab Results  Component Value Date   TSH 3.900 01/04/2020   Lab Results  Component Value Date   WBC 7.9 01/04/2020   HGB 13.1 01/04/2020   HCT 38.2 01/04/2020   MCV 83 01/04/2020   PLT 349 01/04/2020   Lab Results  Component Value Date   NA 142 01/04/2020   K 4.3 01/04/2020   CO2 25 01/04/2020    GLUCOSE 102 (H) 01/04/2020   BUN 9 01/04/2020   CREATININE 0.75 01/04/2020   BILITOT 0.4 01/04/2020   ALKPHOS 129 (H) 01/04/2020   AST 20 01/04/2020   ALT 16 06/14/2020   PROT 7.4 01/04/2020   ALBUMIN 4.7 01/04/2020   CALCIUM 9.6 01/04/2020   Lab Results  Component Value Date   CHOL 173 06/14/2020   Lab Results  Component Value Date   HDL 38 (  L) 06/14/2020   Lab Results  Component Value Date   LDLCALC 111 (H) 06/14/2020   Lab Results  Component Value Date   TRIG 131 06/14/2020   Lab Results  Component Value Date   CHOLHDL 4.6 (H) 06/14/2020   Lab Results  Component Value Date   HGBA1C 6.3 (H) 06/14/2020      Assessment & Plan:   Problem List Items Addressed This Visit      Cardiovascular and Mediastinum   Hypertension associated with diabetes ([place])    -BP at goal -Continue current medication regimen. -Follow DASH diet. -Continue to stay well-hydrated. -Most recent CMP WNL         Endocrine   Diabetes mellitus ([place]) - Primary    -Most recent A1c improved and stable -Discussed with patient discontinuing Metformin if A1c continues to be at goal or decreases further. Patient verbalized understanding and is agreeable. -Continue current medication regimen. -Follow low carbohydrate and glucose diet.      Mixed diabetic hyperlipidemia associated with type 2 diabetes mellitus ([place])    -Most recent lipid panel improved from prior but LDL remains elevated most likely due to missing doses of Lipitor. -Discussed with patient taking medication in the morning to improve compliance and help reduce bad cholesterol. -Follow a heart healthy diet. -Plan to recheck lipid panel and hepatic function next OV.         No orders of the defined types were placed in this encounter.   Follow-up: Return in about 4 months (around 11/18/2020) for HTN, DM, HLD and repeat lipid panel, a1c, cmp.   Note:  This note was prepared with assistance of Dragon voice recognition  software. Occasional wrong-word or sound-a-like substitutions may have occurred due to the inherent limitations of voice recognition software.   Lorrene Reid, PA-C

## 2020-07-19 NOTE — Patient Instructions (Signed)

## 2020-07-19 NOTE — Assessment & Plan Note (Signed)
-  Most recent A1c improved and stable -Discussed with patient discontinuing Metformin if A1c continues to be at goal or decreases further. Patient verbalized understanding and is agreeable. -Continue current medication regimen. -Follow low carbohydrate and glucose diet.

## 2020-07-19 NOTE — Assessment & Plan Note (Signed)
>>  ASSESSMENT AND PLAN FOR DIABETES MELLITUS ([name]) WRITTEN ON 07/19/2020 12:45 PM BY ABONZA, MARITZA, PA-C  -Most recent A1c improved and stable -Discussed with patient discontinuing Metformin if A1c continues to be at goal or decreases further. Patient verbalized understanding and is agreeable. -Continue current medication regimen. -Follow low carbohydrate and glucose diet.

## 2020-07-27 ENCOUNTER — Other Ambulatory Visit: Payer: Self-pay | Admitting: Physician Assistant

## 2020-07-27 DIAGNOSIS — E559 Vitamin D deficiency, unspecified: Secondary | ICD-10-CM

## 2020-11-21 ENCOUNTER — Other Ambulatory Visit: Payer: Self-pay | Admitting: Physician Assistant

## 2020-11-21 DIAGNOSIS — E1159 Type 2 diabetes mellitus with other circulatory complications: Secondary | ICD-10-CM

## 2020-11-21 DIAGNOSIS — I152 Hypertension secondary to endocrine disorders: Secondary | ICD-10-CM

## 2020-11-21 DIAGNOSIS — E1169 Type 2 diabetes mellitus with other specified complication: Secondary | ICD-10-CM

## 2020-11-22 ENCOUNTER — Other Ambulatory Visit: Payer: Self-pay

## 2020-11-22 ENCOUNTER — Encounter: Payer: Self-pay | Admitting: Physician Assistant

## 2020-11-22 ENCOUNTER — Ambulatory Visit: Payer: 59 | Admitting: Physician Assistant

## 2020-11-22 VITALS — BP 138/78 | HR 63 | Ht 63.0 in | Wt 170.0 lb

## 2020-11-22 DIAGNOSIS — E1169 Type 2 diabetes mellitus with other specified complication: Secondary | ICD-10-CM | POA: Diagnosis not present

## 2020-11-22 DIAGNOSIS — E782 Mixed hyperlipidemia: Secondary | ICD-10-CM

## 2020-11-22 DIAGNOSIS — K219 Gastro-esophageal reflux disease without esophagitis: Secondary | ICD-10-CM

## 2020-11-22 DIAGNOSIS — E1159 Type 2 diabetes mellitus with other circulatory complications: Secondary | ICD-10-CM | POA: Diagnosis not present

## 2020-11-22 DIAGNOSIS — I152 Hypertension secondary to endocrine disorders: Secondary | ICD-10-CM

## 2020-11-22 DIAGNOSIS — R7989 Other specified abnormal findings of blood chemistry: Secondary | ICD-10-CM

## 2020-11-22 LAB — POCT GLYCOSYLATED HEMOGLOBIN (HGB A1C): Hemoglobin A1C: 5.9 % — AB (ref 4.0–5.6)

## 2020-11-22 NOTE — Assessment & Plan Note (Signed)
-  Patient has lost 3 pounds since last visit, a total of 16 pound since 12/2019. Encourage to continue with weight loss efforts.

## 2020-11-22 NOTE — Assessment & Plan Note (Signed)
-  A1c today 5.9 ( at goal), decreased from 6.3 despite not taking medication. Will discontinue Metformin and will continue with diet control.  -Recommend to continue ambulatory glucose monitoring, more frequently. -Will continue to monitor.

## 2020-11-22 NOTE — Assessment & Plan Note (Signed)
-  Advised to resume levothyroxine. Will recheck TSH at follow up visit.

## 2020-11-22 NOTE — Assessment & Plan Note (Signed)
-  Patient is not fasting today and has not been taking statin medication so will defer repeating lipid panel until next OV. Advised to resume Lipitor. Patient verbalized understanding and agreeable. -Recommend to follow a heart healthy diet, provided information on Mediterranean diet. -Will continue to monitor.

## 2020-11-22 NOTE — Assessment & Plan Note (Signed)
-  BP mildly above goal today so recommend resuming Losartan 50 mg especially with co-existing co-morbidities with diabetes mellitus. Will discontinue amlodipine. Advised to check BP and pulse at home daily x 2 weeks and keep a log to send via MyChart or drop by the office for medication adjustment.  Patient verbalized understanding. -Recommend to continue good hydration and monitor sodium. -Will continue to monitor.

## 2020-11-22 NOTE — Progress Notes (Signed)
Established Patient Office Visit  Subjective:  Patient ID: Diane Carter, female    DOB: 1961/06/27  Age: 60 y.o. MRN: [id number]  CC:  Chief Complaint  Patient presents with  . Diabetes  . Hypertension  . Hyperlipidemia    HPI Diane Carter presents for follow up on diabetes mellitus, hypertension and hyperlipidemia.  Diabetes: Pt denies increased urination or thirst. Pt reports has not been taking Metformin. No hypoglycemic events. Checking glucose at home occasionally. FBS have been 125 or better. States with the Holidays has not been as good with following diet (keto) but plans to start a new diet plan.  HTN: Pt denies chest pain, palpitations, dizziness or increased lower extremity swelling from baseline. Has not been taking medications as directed. Has not been checking blood pressure at home. Pt continues good hydration.  HLD: Pt has not been taking Lipitor. Patient is not fasting today.  Thyroid: Has not been taking thyroid medication and has felt tired. Brother was recently diagnosed with esophageal cancer so has not been sleeping well at night.  GERD: States recently has been experiencing reflux, 3-4 times the past week. Symptoms usually happen at night and wakes her up. Also reports bloating and gas. Denies N/V. Has not been able to identify foods that trigger her symptoms. Has not tired anything.  History reviewed. No pertinent past medical history.  Past Surgical History:  Procedure Laterality Date  . CESAREAN SECTION  1995  . LIVER RESECTION  1992    Family History  Problem Relation Age of Onset  . Liver cancer Father   . Alcohol abuse Father   . Diabetes type I Paternal Aunt   . Heart attack Other     Social History   Socioeconomic History  . Marital status: Unknown    Spouse name: Not on file  . Number of children: Not on file  . Years of education: Not on file  . Highest education level: Not on file  Occupational History  . Not on file  Tobacco  Use  . Smoking status: Never Smoker  . Smokeless tobacco: Never Used  Vaping Use  . Vaping Use: Never used  Substance and Sexual Activity  . Alcohol use: Never  . Drug use: Never  . Sexual activity: Yes    Partners: Male    Birth control/protection: None  Other Topics Concern  . Not on file  Social History Narrative  . Not on file   Social Determinants of Health   Financial Resource Strain: Not on file  Food Insecurity: Not on file  Transportation Needs: Not on file  Physical Activity: Not on file  Stress: Not on file  Social Connections: Not on file  Intimate Partner Violence: Not on file    Outpatient Medications Prior to Visit  Medication Sig Dispense Refill  . atorvastatin (LIPITOR) 40 MG tablet Take 1 tablet (40 mg total) by mouth at bedtime. Needs labs 4 RF (Patient not taking: Reported on 11/22/2020) 90 tablet 0  . blood glucose meter kit and supplies Dispense based on patient and insurance preference. Use to check glucose levels fasting in the AM and 2 hours after largest meal. (FOR ICD-10 E10.9, E11.9). (Patient not taking: Reported on 11/22/2020) 1 each 0  . levothyroxine (SYNTHROID) 25 MCG tablet Take 0.5 tablets (12.5 mcg total) by mouth daily before breakfast. Needs appt for RF (Patient not taking: Reported on 11/22/2020) 45 tablet 0  . losartan (COZAAR) 100 MG tablet TAKE 1/2 TABLET BY MOUTH DAILY (Patient  not taking: Reported on 11/22/2020) 45 tablet 0  . amLODipine (NORVASC) 5 MG tablet TAKE 0.5 TABLETS (2.5 MG TOTAL) BY MOUTH DAILY. NEEDS APPT FOR REFILLS (Patient not taking: Reported on 11/22/2020) 45 tablet 0  . metFORMIN (GLUCOPHAGE) 500 MG tablet TAKE 1/2 TABLET BY MOUTH TWICE DAILY WITH MEALS (Patient not taking: Reported on 11/22/2020) 90 tablet 0  . Vitamin D, Ergocalciferol, (DRISDOL) 1.25 MG (50000 UNIT) CAPS capsule TAKE 1 CAPSULE (50,000 UNITS TOTAL) BY MOUTH EVERY 7 (SEVEN) DAYS. (Patient not taking: Reported on 11/22/2020) 12 capsule 1   No facility-administered  medications prior to visit.    No Known Allergies  ROS Review of Systems A fourteen system review of systems was performed and found to be positive as per HPI.   Objective:    Physical Exam General:  Well Developed, well nourished, appropriate for stated age.  Neuro:  Alert and oriented,  extra-ocular muscles intact  HEENT:  Normocephalic, atraumatic, neck supple Skin:  no gross rash, warm, pink. Cardiac:  RRR, S1 S2, w/o murmur  Respiratory:  ECTA B/L, Not using accessory muscles, speaking in full sentences- unlabored. Vascular:  Ext warm, no cyanosis apprec.; cap RF less 2 sec. Psych:  No HI/SI, judgement and insight good, Euthymic mood. Full Affect.   BP 138/78   Pulse 63   Ht '5\' 3"'  (1.6 m)   Wt 170 lb (77.1 kg)   SpO2 100%   BMI 30.11 kg/m  Wt Readings from Last 3 Encounters:  11/22/20 170 lb (77.1 kg)  07/19/20 173 lb 14.4 oz (78.9 kg)  01/04/20 186 lb 4.8 oz (84.5 kg)     Health Maintenance Due  Topic Date Due  . PNEUMOCOCCAL POLYSACCHARIDE VACCINE AGE 44-64 HIGH RISK  Never done  . COVID-19 Vaccine (1) Never done  . HIV Screening  Never done  . OPHTHALMOLOGY EXAM  01/29/2020  . PAP SMEAR-Modifier  06/03/2020  . INFLUENZA VACCINE  Never done    There are no preventive care reminders to display for this patient.  Lab Results  Component Value Date   TSH 3.900 01/04/2020   Lab Results  Component Value Date   WBC 7.9 01/04/2020   HGB 13.1 01/04/2020   HCT 38.2 01/04/2020   MCV 83 01/04/2020   PLT 349 01/04/2020   Lab Results  Component Value Date   NA 142 01/04/2020   K 4.3 01/04/2020   CO2 25 01/04/2020   GLUCOSE 102 (H) 01/04/2020   BUN 9 01/04/2020   CREATININE 0.75 01/04/2020   BILITOT 0.4 01/04/2020   ALKPHOS 129 (H) 01/04/2020   AST 20 01/04/2020   ALT 16 06/14/2020   PROT 7.4 01/04/2020   ALBUMIN 4.7 01/04/2020   CALCIUM 9.6 01/04/2020   Lab Results  Component Value Date   CHOL 173 06/14/2020   Lab Results  Component Value Date    HDL 38 (L) 06/14/2020   Lab Results  Component Value Date   LDLCALC 111 (H) 06/14/2020   Lab Results  Component Value Date   TRIG 131 06/14/2020   Lab Results  Component Value Date   CHOLHDL 4.6 (H) 06/14/2020   Lab Results  Component Value Date   HGBA1C 5.9 (A) 11/22/2020      Assessment & Plan:   Problem List Items Addressed This Visit      Cardiovascular and Mediastinum   Hypertension associated with diabetes ([place])    -BP mildly above goal today so recommend resuming Losartan 50 mg especially with co-existing co-morbidities  with diabetes mellitus. Will discontinue amlodipine. Advised to check BP and pulse at home daily x 2 weeks and keep a log to send via MyChart or drop by the office for medication adjustment.  Patient verbalized understanding. -Recommend to continue good hydration and monitor sodium. -Will continue to monitor.        Endocrine   Diabetes mellitus ([place]) - Primary    -A1c today 5.9 ( at goal), decreased from 6.3 despite not taking medication. Will discontinue Metformin and will continue with diet control.  -Recommend to continue ambulatory glucose monitoring, more frequently. -Will continue to monitor.      Relevant Orders   POCT glycosylated hemoglobin (Hb A1C) (Completed)   Mixed diabetic hyperlipidemia associated with type 2 diabetes mellitus ([place])    -Patient is not fasting today and has not been taking statin medication so will defer repeating lipid panel until next OV. Advised to resume Lipitor. Patient verbalized understanding and agreeable. -Recommend to follow a heart healthy diet, provided information on Mediterranean diet. -Will continue to monitor.        Other   Elevated TSH-  with significant fatigue and symptoms (Chronic)    -Advised to resume levothyroxine. Will recheck TSH at follow up visit.      OBesity assoc with DM, HTN, Chol, hypothyroidism, vit D def etc (Chronic)    -Patient has lost 3 pounds since last visit, a  total of 16 pound since 12/2019. Encourage to continue with weight loss efforts.        Other Visit Diagnoses    Gastroesophageal reflux disease, unspecified whether esophagitis present         GERD: -Dicussed with patient management options and recommend to try H2 blocker such as famotidine (Pepcid). If symptoms fail to improve or worsen recommend PPI such as omeprazole.  -Recommend to keep a food journal to help identify provocative foods, avoid eating evening meal late and elevate the head of the bed.   No orders of the defined types were placed in this encounter.   Follow-up: Return in about 3 months (around 02/20/2021) for DM- stopped med, HTN- restarted med, HLD, thyroid and FBW.    Lorrene Reid, PA-C

## 2020-11-22 NOTE — Assessment & Plan Note (Signed)
>>  ASSESSMENT AND PLAN FOR DIABETES MELLITUS ([name]) WRITTEN ON 11/22/2020 12:38 PM BY ABONZA, MARITZA, PA-C  -A1c today 5.9 ( at goal), decreased from 6.3 despite not taking medication. Will discontinue Metformin and will continue with diet control.  -Recommend to continue ambulatory glucose monitoring, more frequently. -Will continue to monitor.

## 2020-11-22 NOTE — Patient Instructions (Signed)
Try over the counter Pepcid for reflux for few weeks. If symptoms don't improve or worsen recommend to try Prilosec.  Check BP and pulse at home x 2 weeks and keep a log to send via [facility] or drop by the office to determine appropriate dose for Losartan.    Mediterranean Diet A Mediterranean diet refers to food and lifestyle choices that are based on the traditions of countries located on the [employer]. This way of eating has been shown to help prevent certain conditions and improve outcomes for people who have chronic diseases, like kidney disease and heart disease. What are tips for following this plan? Lifestyle  Cook and eat meals together with your family, when possible.  Drink enough fluid to keep your urine clear or pale yellow.  Be physically active every day. This includes: ? Aerobic exercise like running or swimming. ? Leisure activities like gardening, walking, or housework.  Get 7-8 hours of sleep each night.  If recommended by your health care provider, drink red wine in moderation. This means 1 glass a day for nonpregnant women and 2 glasses a day for men. A glass of wine equals 5 oz (150 mL). Reading food labels   Check the serving size of packaged foods. For foods such as rice and pasta, the serving size refers to the amount of cooked product, not dry.  Check the total fat in packaged foods. Avoid foods that have saturated fat or trans fats.  Check the ingredients list for added sugars, such as corn syrup. Shopping  At the grocery store, buy most of your food from the areas near the walls of the store. This includes: ? Fresh fruits and vegetables (produce). ? Grains, beans, nuts, and seeds. Some of these may be available in unpackaged forms or large amounts (in bulk). ? Fresh seafood. ? Poultry and eggs. ? Low-fat dairy products.  Buy whole ingredients instead of prepackaged foods.  Buy fresh fruits and vegetables in-season from local farmers  markets.  Buy frozen fruits and vegetables in resealable bags.  If you do not have access to quality fresh seafood, buy precooked frozen shrimp or canned fish, such as tuna, salmon, or sardines.  Buy small amounts of raw or cooked vegetables, salads, or olives from the deli or salad bar at your store.  Stock your pantry so you always have certain foods on hand, such as olive oil, canned tuna, canned tomatoes, rice, pasta, and beans. Cooking  Cook foods with extra-virgin olive oil instead of using butter or other vegetable oils.  Have meat as a side dish, and have vegetables or grains as your main dish. This means having meat in small portions or adding small amounts of meat to foods like pasta or stew.  Use beans or vegetables instead of meat in common dishes like chili or lasagna.  Experiment with different cooking methods. Try roasting or broiling vegetables instead of steaming or sauteing them.  Add frozen vegetables to soups, stews, pasta, or rice.  Add nuts or seeds for added healthy fat at each meal. You can add these to yogurt, salads, or vegetable dishes.  Marinate fish or vegetables using olive oil, lemon juice, garlic, and fresh herbs. Meal planning   Plan to eat 1 vegetarian meal one day each week. Try to work up to 2 vegetarian meals, if possible.  Eat seafood 2 or more times a week.  Have healthy snacks readily available, such as: ? Vegetable sticks with hummus. ? [place] yogurt. ? Fruit  and nut trail mix.  Eat balanced meals throughout the week. This includes: ? Fruit: 2-3 servings a day ? Vegetables: 4-5 servings a day ? Low-fat dairy: 2 servings a day ? Fish, poultry, or lean meat: 1 serving a day ? Beans and legumes: 2 or more servings a week ? Nuts and seeds: 1-2 servings a day ? Whole grains: 6-8 servings a day ? Extra-virgin olive oil: 3-4 servings a day  Limit red meat and sweets to only a few servings a month What are my food  choices?  Mediterranean diet ? Recommended  Grains: Whole-grain pasta. Brown rice. Bulgar wheat. Polenta. Couscous. Whole-wheat bread. Orpah Cobb.  Vegetables: Artichokes. Beets. Broccoli. Cabbage. Carrots. Eggplant. Green beans. Chard. Kale. Spinach. Onions. Leeks. Peas. Squash. Tomatoes. Peppers. Radishes.  Fruits: Apples. Apricots. Avocado. Berries. Bananas. Cherries. Dates. Figs. Grapes. Lemons. Melon. Oranges. Peaches. Plums. Pomegranate.  Meats and other protein foods: Beans. Almonds. Sunflower seeds. Pine nuts. Peanuts. Cod. Salmon. Scallops. Shrimp. Tuna. Tilapia. Clams. Oysters. Eggs.  Dairy: Low-fat milk. Cheese. Greek yogurt.  Beverages: Water. Red wine. Herbal tea.  Fats and oils: Extra virgin olive oil. Avocado oil. Grape seed oil.  Sweets and desserts: [place] yogurt with honey. Baked apples. Poached pears. Trail mix.  Seasoning and other foods: Basil. Cilantro. Coriander. Cumin. Mint. Parsley. Sage. Rosemary. Tarragon. Garlic. Oregano. Thyme. Pepper. Balsalmic vinegar. Tahini. Hummus. Tomato sauce. Olives. Mushrooms. ? Limit these  Grains: Prepackaged pasta or rice dishes. Prepackaged cereal with added sugar.  Vegetables: Deep fried potatoes (french fries).  Fruits: Fruit canned in syrup.  Meats and other protein foods: Beef. Pork. Lamb. Poultry with skin. Hot dogs. Tomasa Blase.  Dairy: Ice cream. Sour cream. Whole milk.  Beverages: Juice. Sugar-sweetened soft drinks. Beer. Liquor and spirits.  Fats and oils: Butter. Canola oil. Vegetable oil. Beef fat (tallow). Lard.  Sweets and desserts: Cookies. Cakes. Pies. Candy.  Seasoning and other foods: Mayonnaise. Premade sauces and marinades. The items listed may not be a complete list. Talk with your dietitian about what dietary choices are right for you. Summary  The Mediterranean diet includes both food and lifestyle choices.  Eat a variety of fresh fruits and vegetables, beans, nuts, seeds, and whole  grains.  Limit the amount of red meat and sweets that you eat.  Talk with your health care provider about whether it is safe for you to drink red wine in moderation. This means 1 glass a day for nonpregnant women and 2 glasses a day for men. A glass of wine equals 5 oz (150 mL). This information is not intended to replace advice given to you by your health care provider. Make sure you discuss any questions you have with your health care provider. Document Revised: 07/04/2016 Document Reviewed: 06/27/2016 Elsevier Patient Education  2020 Elsevier Inc.   Gastroesophageal Reflux Disease, Adult Gastroesophageal reflux (GER) happens when acid from the stomach flows up into the tube that connects the mouth and the stomach (esophagus). Normally, food travels down the esophagus and stays in the stomach to be digested. With GER, food and stomach acid sometimes move back up into the esophagus. You may have a disease called gastroesophageal reflux disease (GERD) if the reflux:  Happens often.  Causes frequent or very bad symptoms.  Causes problems such as damage to the esophagus. When this happens, the esophagus becomes sore and swollen (inflamed). Over time, GERD can make small holes (ulcers) in the lining of the esophagus. What are the causes? This condition is caused by a problem  with the muscle between the esophagus and the stomach. When this muscle is weak or not normal, it does not close properly to keep food and acid from coming back up from the stomach. The muscle can be weak because of:  Tobacco use.  Pregnancy.  Having a certain type of hernia (hiatal hernia).  Alcohol use.  Certain foods and drinks, such as coffee, chocolate, onions, and peppermint. What increases the risk? You are more likely to develop this condition if you:  Are overweight.  Have a disease that affects your connective tissue.  Use NSAID medicines. What are the signs or symptoms? Symptoms of this condition  include:  Heartburn.  Difficult or painful swallowing.  The feeling of having a lump in the throat.  A bitter taste in the mouth.  Bad breath.  Having a lot of saliva.  Having an upset or bloated stomach.  Belching.  Chest pain. Different conditions can cause chest pain. Make sure you see your doctor if you have chest pain.  Shortness of breath or noisy breathing (wheezing).  Ongoing (chronic) cough or a cough at night.  Wearing away of the surface of teeth (tooth enamel).  Weight loss. How is this treated? Treatment will depend on how bad your symptoms are. Your doctor may suggest:  Changes to your diet.  Medicine.  Surgery. Follow these instructions at home: Eating and drinking   Follow a diet as told by your doctor. You may need to avoid foods and drinks such as: ? Coffee and tea (with or without caffeine). ? Drinks that contain alcohol. ? Energy drinks and sports drinks. ? Bubbly (carbonated) drinks or sodas. ? Chocolate and cocoa. ? Peppermint and mint flavorings. ? Garlic and onions. ? Horseradish. ? Spicy and acidic foods. These include peppers, chili powder, curry powder, vinegar, hot sauces, and BBQ sauce. ? Citrus fruit juices and citrus fruits, such as oranges, lemons, and limes. ? Tomato-based foods. These include red sauce, chili, salsa, and pizza with red sauce. ? Fried and fatty foods. These include donuts, french fries, potato chips, and high-fat dressings. ? High-fat meats. These include hot dogs, rib eye steak, sausage, ham, and bacon. ? High-fat dairy items, such as whole milk, butter, and cream cheese.  Eat small meals often. Avoid eating large meals.  Avoid drinking large amounts of liquid with your meals.  Avoid eating meals during the 2-3 hours before bedtime.  Avoid lying down right after you eat.  Do not exercise right after you eat. Lifestyle   Do not use any products that contain nicotine or tobacco. These include  cigarettes, e-cigarettes, and chewing tobacco. If you need help quitting, ask your doctor.  Try to lower your stress. If you need help doing this, ask your doctor.  If you are overweight, lose an amount of weight that is healthy for you. Ask your doctor about a safe weight loss goal. General instructions  Pay attention to any changes in your symptoms.  Take over-the-counter and prescription medicines only as told by your doctor. Do not take aspirin, ibuprofen, or other NSAIDs unless your doctor says it is okay.  Wear loose clothes. Do not wear anything tight around your waist.  Raise (elevate) the head of your bed about 6 inches (15 cm).  Avoid bending over if this makes your symptoms worse.  Keep all follow-up visits as told by your doctor. This is important. Contact a doctor if:  You have new symptoms.  You lose weight and you do not  know why.  You have trouble swallowing or it hurts to swallow.  You have wheezing or a cough that keeps happening.  Your symptoms do not get better with treatment.  You have a hoarse voice. Get help right away if:  You have pain in your arms, neck, jaw, teeth, or back.  You feel sweaty, dizzy, or light-headed.  You have chest pain or shortness of breath.  You throw up (vomit) and your throw-up looks like blood or coffee grounds.  You pass out (faint).  Your poop (stool) is bloody or black.  You cannot swallow, drink, or eat. Summary  If a person has gastroesophageal reflux disease (GERD), food and stomach acid move back up into the esophagus and cause symptoms or problems such as damage to the esophagus.  Treatment will depend on how bad your symptoms are.  Follow a diet as told by your doctor.  Take all medicines only as told by your doctor. This information is not intended to replace advice given to you by your health care provider. Make sure you discuss any questions you have with your health care provider. Document Revised:  05/13/2018 Document Reviewed: 05/13/2018 Elsevier Patient Education  2020 [employer].

## 2021-02-15 ENCOUNTER — Other Ambulatory Visit: Payer: Self-pay | Admitting: Physician Assistant

## 2021-02-15 DIAGNOSIS — E1169 Type 2 diabetes mellitus with other specified complication: Secondary | ICD-10-CM

## 2021-02-15 DIAGNOSIS — E782 Mixed hyperlipidemia: Secondary | ICD-10-CM

## 2021-02-15 DIAGNOSIS — E1159 Type 2 diabetes mellitus with other circulatory complications: Secondary | ICD-10-CM

## 2021-02-15 DIAGNOSIS — R7989 Other specified abnormal findings of blood chemistry: Secondary | ICD-10-CM

## 2021-02-15 DIAGNOSIS — E559 Vitamin D deficiency, unspecified: Secondary | ICD-10-CM

## 2021-02-15 DIAGNOSIS — I152 Hypertension secondary to endocrine disorders: Secondary | ICD-10-CM

## 2021-02-22 ENCOUNTER — Other Ambulatory Visit: Payer: 59

## 2021-02-22 ENCOUNTER — Ambulatory Visit: Payer: 59 | Admitting: Physician Assistant

## 2021-03-05 ENCOUNTER — Other Ambulatory Visit: Payer: Self-pay | Admitting: Physician Assistant

## 2021-03-05 DIAGNOSIS — I152 Hypertension secondary to endocrine disorders: Secondary | ICD-10-CM

## 2021-03-05 DIAGNOSIS — E1159 Type 2 diabetes mellitus with other circulatory complications: Secondary | ICD-10-CM

## 2021-03-05 DIAGNOSIS — E1169 Type 2 diabetes mellitus with other specified complication: Secondary | ICD-10-CM

## 2021-03-05 DIAGNOSIS — Z0000 Encounter for general adult medical examination without abnormal findings: Secondary | ICD-10-CM

## 2021-03-05 DIAGNOSIS — E559 Vitamin D deficiency, unspecified: Secondary | ICD-10-CM

## 2021-03-16 ENCOUNTER — Other Ambulatory Visit: Payer: 59

## 2021-03-16 ENCOUNTER — Other Ambulatory Visit: Payer: Self-pay

## 2021-03-16 DIAGNOSIS — E559 Vitamin D deficiency, unspecified: Secondary | ICD-10-CM

## 2021-03-16 DIAGNOSIS — R7989 Other specified abnormal findings of blood chemistry: Secondary | ICD-10-CM

## 2021-03-16 DIAGNOSIS — I152 Hypertension secondary to endocrine disorders: Secondary | ICD-10-CM

## 2021-03-16 DIAGNOSIS — E1169 Type 2 diabetes mellitus with other specified complication: Secondary | ICD-10-CM

## 2021-03-16 DIAGNOSIS — E1159 Type 2 diabetes mellitus with other circulatory complications: Secondary | ICD-10-CM

## 2021-03-17 LAB — COMPREHENSIVE METABOLIC PANEL
ALT: 18 IU/L (ref 0–32)
AST: 19 IU/L (ref 0–40)
Albumin/Globulin Ratio: 1.8 (ref 1.2–2.2)
Albumin: 4.6 g/dL (ref 3.8–4.8)
Alkaline Phosphatase: 107 IU/L (ref 44–121)
BUN/Creatinine Ratio: 19 (ref 12–28)
BUN: 11 mg/dL (ref 8–27)
Bilirubin Total: 0.5 mg/dL (ref 0.0–1.2)
CO2: 24 mmol/L (ref 20–29)
Calcium: 9.3 mg/dL (ref 8.7–10.3)
Chloride: 103 mmol/L (ref 96–106)
Creatinine, Ser: 0.57 mg/dL (ref 0.57–1.00)
Globulin, Total: 2.6 g/dL (ref 1.5–4.5)
Glucose: 113 mg/dL — ABNORMAL HIGH (ref 65–99)
Potassium: 4.3 mmol/L (ref 3.5–5.2)
Sodium: 144 mmol/L (ref 134–144)
Total Protein: 7.2 g/dL (ref 6.0–8.5)
eGFR: 103 mL/min/{1.73_m2} (ref >59)

## 2021-03-17 LAB — CBC
Hematocrit: 38.1 % (ref 34.0–46.6)
Hemoglobin: 12.8 g/dL (ref 11.1–15.9)
MCH: 28.8 pg (ref 26.6–33.0)
MCHC: 33.6 g/dL (ref 31.5–35.7)
MCV: 86 fL (ref 79–97)
Platelets: 208 10*3/uL (ref 150–450)
RBC: 4.45 x10E6/uL (ref 3.77–5.28)
RDW: 13.3 % (ref 11.7–15.4)
WBC: 6.7 10*3/uL (ref 3.4–10.8)

## 2021-03-17 LAB — LIPID PANEL
Chol/HDL Ratio: 3.8 ratio (ref 0.0–4.4)
Cholesterol, Total: 191 mg/dL (ref 100–199)
HDL: 50 mg/dL (ref >39)
LDL Chol Calc (NIH): 113 mg/dL — ABNORMAL HIGH (ref 0–99)
Triglycerides: 160 mg/dL — ABNORMAL HIGH (ref 0–149)
VLDL Cholesterol Cal: 28 mg/dL (ref 5–40)

## 2021-03-17 LAB — VITAMIN D 25 HYDROXY (VIT D DEFICIENCY, FRACTURES): Vit D, 25-Hydroxy: 22.3 ng/mL — ABNORMAL LOW (ref 30.0–100.0)

## 2021-03-17 LAB — HEMOGLOBIN A1C
Est. average glucose Bld gHb Est-mCnc: 143 mg/dL
Hgb A1c MFr Bld: 6.6 % — ABNORMAL HIGH (ref 4.8–5.6)

## 2021-03-17 LAB — TSH: TSH: 3.73 u[IU]/mL (ref 0.450–4.500)

## 2021-03-19 ENCOUNTER — Ambulatory Visit: Payer: 59 | Admitting: Physician Assistant

## 2021-03-19 ENCOUNTER — Other Ambulatory Visit: Payer: Self-pay

## 2021-03-19 ENCOUNTER — Encounter: Payer: Self-pay | Admitting: Physician Assistant

## 2021-03-19 VITALS — BP 121/66 | HR 72 | Temp 97.8°F | Ht 63.0 in | Wt 186.3 lb

## 2021-03-19 DIAGNOSIS — E1169 Type 2 diabetes mellitus with other specified complication: Secondary | ICD-10-CM

## 2021-03-19 DIAGNOSIS — E559 Vitamin D deficiency, unspecified: Secondary | ICD-10-CM

## 2021-03-19 DIAGNOSIS — I152 Hypertension secondary to endocrine disorders: Secondary | ICD-10-CM | POA: Diagnosis not present

## 2021-03-19 DIAGNOSIS — E1159 Type 2 diabetes mellitus with other circulatory complications: Secondary | ICD-10-CM | POA: Diagnosis not present

## 2021-03-19 DIAGNOSIS — E782 Mixed hyperlipidemia: Secondary | ICD-10-CM

## 2021-03-19 LAB — POCT UA - MICROALBUMIN
Albumin/Creatinine Ratio, Urine, POC: <30
Creatinine, POC: 100 mg/dL
Microalbumin Ur, POC: 10 mg/L

## 2021-03-19 MED ORDER — LOSARTAN POTASSIUM 100 MG PO TABS: 0.5000 | ORAL_TABLET | Freq: Every day | ORAL | 1 refills | Status: DC

## 2021-03-19 MED ORDER — AMLODIPINE BESYLATE 2.5 MG PO TABS: 2.5000 mg | ORAL_TABLET | Freq: Every day | ORAL | 1 refills | Status: DC

## 2021-03-19 MED ORDER — METFORMIN HCL 500 MG PO TABS: 250.0000 mg | ORAL_TABLET | Freq: Two times a day (BID) | ORAL | 1 refills | Status: DC

## 2021-03-19 NOTE — Progress Notes (Signed)
Established Patient Office Visit  Subjective:  Patient ID: Diane Carter, female    DOB: 1961-09-13  Age: 60 y.o. MRN: [id number]  CC:  Chief Complaint  Patient presents with  . Diabetes  . Hypertension  . Hyperlipidemia    HPI Diane Carter presents for follow up on diabetes mellitus, hypertension, and hyperlipidemia.  Diabetes mellitus: Pt denies increased urination or thirst. Pt reports has not been as diligent with monitoring carbohydrates and sugar. No hypoglycemic events. Checking glucose at home occasionally, sugar readings range from 120-130. Patient reports plans to possibly join weight loss program Next65 days. Was previously doing Keto diet.  HTN: Pt denies chest pain, palpitations, dizziness, orthopnea or increased lower extremity edema. Taking medication as directed without side effects. Reports did restart half dose of amlodipine because starting feeling and noticing her blood pressure was running higher than normal. Patient does not have BP log, reports systolic BP of 817/71H.  HLD: Pt started taking atorvastatin about 1 week ago. States plans to start making dietary changes by eating more lean meats such as chicken and following similar diet to plant based.    History reviewed. No pertinent past medical history.  Past Surgical History:  Procedure Laterality Date  . CESAREAN SECTION  1995  . LIVER RESECTION  1992    Family History  Problem Relation Age of Onset  . Liver cancer Father   . Alcohol abuse Father   . Diabetes type I Paternal Aunt   . Heart attack Other     Social History   Socioeconomic History  . Marital status: Unknown    Spouse name: Not on file  . Number of children: Not on file  . Years of education: Not on file  . Highest education level: Not on file  Occupational History  . Not on file  Tobacco Use  . Smoking status: Never Smoker  . Smokeless tobacco: Never Used  Vaping Use  . Vaping Use: Never used  Substance and Sexual  Activity  . Alcohol use: Never  . Drug use: Never  . Sexual activity: Yes    Partners: Male    Birth control/protection: None  Other Topics Concern  . Not on file  Social History Narrative  . Not on file   Social Determinants of Health   Financial Resource Strain: Not on file  Food Insecurity: Not on file  Transportation Needs: Not on file  Physical Activity: Not on file  Stress: Not on file  Social Connections: Not on file  Intimate Partner Violence: Not on file    Outpatient Medications Prior to Visit  Medication Sig Dispense Refill  . atorvastatin (LIPITOR) 40 MG tablet Take 1 tablet (40 mg total) by mouth at bedtime. Needs labs 4 RF 90 tablet 0  . blood glucose meter kit and supplies Dispense based on patient and insurance preference. Use to check glucose levels fasting in the AM and 2 hours after largest meal. (FOR ICD-10 E10.9, E11.9). 1 each 0  . levothyroxine (SYNTHROID) 25 MCG tablet Take 0.5 tablets (12.5 mcg total) by mouth daily before breakfast. Needs appt for RF 45 tablet 0  . losartan (COZAAR) 100 MG tablet TAKE 1/2 TABLET BY MOUTH DAILY 45 tablet 0   No facility-administered medications prior to visit.    No Known Allergies  ROS Review of Systems A fourteen system review of systems was performed and found to be positive as per HPI.   Objective:    Physical Exam General:  Well Developed, well  nourished, appropriate for stated age.  Neuro:  Alert and oriented,  extra-ocular muscles intact  HEENT:  Normocephalic, atraumatic, neck supple Skin:  no gross rash, warm, pink. Cardiac:  RRR, S1 S2 Respiratory:  ECTA B/L, Not using accessory muscles, speaking in full sentences- unlabored. Vascular:  Ext warm, no cyanosis apprec.; cap RF less 2 sec. No edema  Psych:  No HI/SI, judgement and insight good, Euthymic mood. Full Affect.   BP 121/66   Pulse 72   Temp 97.8 F (36.6 C)   Ht '5\' 3"'  (1.6 m)   Wt 186 lb 4.8 oz (84.5 kg)   SpO2 99%   BMI 33.00 kg/m   Wt Readings from Last 3 Encounters:  03/19/21 186 lb 4.8 oz (84.5 kg)  11/22/20 170 lb (77.1 kg)  07/19/20 173 lb 14.4 oz (78.9 kg)     Health Maintenance Due  Topic Date Due  . PNEUMOCOCCAL POLYSACCHARIDE VACCINE AGE 60-64 HIGH RISK  Never done  . COVID-19 Vaccine (1) Never done  . HIV Screening  Never done  . OPHTHALMOLOGY EXAM  01/29/2020  . PAP SMEAR-Modifier  06/03/2020    There are no preventive care reminders to display for this patient.  Lab Results  Component Value Date   TSH 3.730 03/16/2021   Lab Results  Component Value Date   WBC 6.7 03/16/2021   HGB 12.8 03/16/2021   HCT 38.1 03/16/2021   MCV 86 03/16/2021   PLT 208 03/16/2021   Lab Results  Component Value Date   NA 144 03/16/2021   K 4.3 03/16/2021   CO2 24 03/16/2021   GLUCOSE 113 (H) 03/16/2021   BUN 11 03/16/2021   CREATININE 0.57 03/16/2021   BILITOT 0.5 03/16/2021   ALKPHOS 107 03/16/2021   AST 19 03/16/2021   ALT 18 03/16/2021   PROT 7.2 03/16/2021   ALBUMIN 4.6 03/16/2021   CALCIUM 9.3 03/16/2021   EGFR 103 03/16/2021   Lab Results  Component Value Date   CHOL 191 03/16/2021   Lab Results  Component Value Date   HDL 50 03/16/2021   Lab Results  Component Value Date   LDLCALC 113 (H) 03/16/2021   Lab Results  Component Value Date   TRIG 160 (H) 03/16/2021   Lab Results  Component Value Date   CHOLHDL 3.8 03/16/2021   Lab Results  Component Value Date   HGBA1C 6.6 (H) 03/16/2021      Assessment & Plan:   Problem List Items Addressed This Visit      Cardiovascular and Mediastinum   Hypertension associated with diabetes ([place])    -Controlled. -Continue Losartan 50 mg and amlodipine 2.5 mg. Recent CMP, renal function and electrolytes are normal. Provided refills. -Will continue to monitor.      Relevant Medications   amLODipine (NORVASC) 2.5 MG tablet   metFORMIN (GLUCOPHAGE) 500 MG tablet   losartan (COZAAR) 100 MG tablet     Endocrine   Diabetes mellitus  ([place]) - Primary    -Recent A1c has increased from 5.9 to 6.6, discussed with patient restarting Metformin 500 mg/day. Patient is agreeable. Rx sent. -Recommend to resume weight loss efforts, monitor/reduce carbohydrates and sugar. -UA microalbumin collected today, normal. -Will continue to monitor.      Relevant Medications   metFORMIN (GLUCOPHAGE) 500 MG tablet   losartan (COZAAR) 100 MG tablet   Other Relevant Orders   POCT UA - Microalbumin (Completed)   Mixed diabetic hyperlipidemia associated with type 2 diabetes mellitus ([place])    -  Recent lipid panel: total cholesterol 191, triglycerides 160, HDL 50 (improved from 38), LDL 113 (goal <70). -Continue atorvastatin 40 mg. Recent hepatic function normal. -Recommend to follow a heart healthy diet, reduce simple carbohydrates and weight loss. -Will continue to monitor and repeat lipid panel/hepatic function at follow up.       Relevant Medications   amLODipine (NORVASC) 2.5 MG tablet   metFORMIN (GLUCOPHAGE) 500 MG tablet   losartan (COZAAR) 100 MG tablet     Other   Vitamin D deficiency    -Recent Vit D 22.3, advised to start taking Vit D3 2000 units daily.        Most labs recently obtain are essentially within normal limits or stable from prior with the exception of A1c, lipid panel and Vit D.   Meds ordered this encounter  Medications  . amLODipine (NORVASC) 2.5 MG tablet    Sig: Take 1 tablet (2.5 mg total) by mouth daily.    Dispense:  90 tablet    Refill:  1    Order Specific Question:   Supervising Provider    Answer:   Beatrice Lecher D [[id number]]  . metFORMIN (GLUCOPHAGE) 500 MG tablet    Sig: Take 0.5 tablets (250 mg total) by mouth 2 (two) times daily with a meal.    Dispense:  90 tablet    Refill:  1    Order Specific Question:   Supervising Provider    Answer:   Beatrice Lecher D [[id number]]  . losartan (COZAAR) 100 MG tablet    Sig: Take 0.5 tablets (50 mg total) by mouth daily.    Dispense:  45 tablet     Refill:  1    Order Specific Question:   Supervising Provider    Answer:   Beatrice Lecher D [[id number]]    Follow-up: Return in about 4 months (around 07/20/2021) for DM, HTN, thyroid and FBW (lipid panel, cmp, a1c, tsh).   Note:  This note was prepared with assistance of Dragon voice recognition software. Occasional wrong-word or sound-a-like substitutions may have occurred due to the inherent limitations of voice recognition software.  Lorrene Reid, PA-C

## 2021-03-19 NOTE — Assessment & Plan Note (Addendum)
-  Controlled. -Continue Losartan 50 mg and amlodipine 2.5 mg. Recent CMP, renal function and electrolytes are normal. Provided refills. -Will continue to monitor.

## 2021-03-19 NOTE — Assessment & Plan Note (Addendum)
-  Recent A1c has increased from 5.9 to 6.6, discussed with patient restarting Metformin 500 mg/day. Patient is agreeable. Rx sent. -Recommend to resume weight loss efforts, monitor/reduce carbohydrates and sugar. -UA microalbumin collected today, normal. -Will continue to monitor.

## 2021-03-19 NOTE — Assessment & Plan Note (Signed)
>>  ASSESSMENT AND PLAN FOR DIABETES MELLITUS ([name]) WRITTEN ON 03/19/2021 11:56 AM BY ABONZA, MARITZA, PA-C  -Recent A1c has increased from 5.9 to 6.6, discussed with patient restarting Metformin 500 mg/day. Patient is agreeable. Rx sent. -Recommend to resume weight loss efforts, monitor/reduce carbohydrates and sugar. -UA microalbumin collected today, normal. -Will continue to monitor.

## 2021-03-19 NOTE — Assessment & Plan Note (Signed)
-  Recent lipid panel: total cholesterol 191, triglycerides 160, HDL 50 (improved from 38), LDL 113 (goal <70). -Continue atorvastatin 40 mg. Recent hepatic function normal. -Recommend to follow a heart healthy diet, reduce simple carbohydrates and weight loss. -Will continue to monitor and repeat lipid panel/hepatic function at follow up.

## 2021-03-19 NOTE — Assessment & Plan Note (Signed)
-  Recent Vit D 22.3, advised to start taking Vit D3 2000 units daily.

## 2021-03-19 NOTE — Patient Instructions (Signed)
Diabetes Mellitus and Nutrition, Adult When you have diabetes, or diabetes mellitus, it is very important to have healthy eating habits because your blood sugar (glucose) levels are greatly affected by what you eat and drink. Eating healthy foods in the right amounts, at about the same times every day, can help you:  Control your blood glucose.  Lower your risk of heart disease.  Improve your blood pressure.  Reach or maintain a healthy weight. What can affect my meal plan? Every person with diabetes is different, and each person has different needs for a meal plan. Your health care provider may recommend that you work with a dietitian to make a meal plan that is best for you. Your meal plan may vary depending on factors such as:  The calories you need.  The medicines you take.  Your weight.  Your blood glucose, blood pressure, and cholesterol levels.  Your activity level.  Other health conditions you have, such as heart or kidney disease. How do carbohydrates affect me? Carbohydrates, also called carbs, affect your blood glucose level more than any other type of food. Eating carbs naturally raises the amount of glucose in your blood. Carb counting is a method for keeping track of how many carbs you eat. Counting carbs is important to keep your blood glucose at a healthy level, especially if you use insulin or take certain oral diabetes medicines. It is important to know how many carbs you can safely have in each meal. This is different for every person. Your dietitian can help you calculate how many carbs you should have at each meal and for each snack. How does alcohol affect me? Alcohol can cause a sudden decrease in blood glucose (hypoglycemia), especially if you use insulin or take certain oral diabetes medicines. Hypoglycemia can be a life-threatening condition. Symptoms of hypoglycemia, such as sleepiness, dizziness, and confusion, are similar to symptoms of having too much  alcohol.  Do not drink alcohol if: ? Your health care provider tells you not to drink. ? You are pregnant, may be pregnant, or are planning to become pregnant.  If you drink alcohol: ? Do not drink on an empty stomach. ? Limit how much you use to:  0-1 drink a day for women.  0-2 drinks a day for men. ? Be aware of how much alcohol is in your drink. In the [place] one drink equals one 12 oz bottle of beer (355 mL), one 5 oz glass of wine (148 mL), or one 1 oz glass of hard liquor (44 mL). ? Keep yourself hydrated with water, diet soda, or unsweetened iced tea.  Keep in mind that regular soda, juice, and other mixers may contain a lot of sugar and must be counted as carbs. What are tips for following this plan? Reading food labels  Start by checking the serving size on the "Nutrition Facts" label of packaged foods and drinks. The amount of calories, carbs, fats, and other nutrients listed on the label is based on one serving of the item. Many items contain more than one serving per package.  Check the total grams (g) of carbs in one serving. You can calculate the number of servings of carbs in one serving by dividing the total carbs by 15. For example, if a food has 30 g of total carbs per serving, it would be equal to 2 servings of carbs.  Check the number of grams (g) of saturated fats and trans fats in one serving. Choose foods that have   a low amount or none of these fats.  Check the number of milligrams (mg) of salt (sodium) in one serving. Most people should limit total sodium intake to less than 2,300 mg per day.  Always check the nutrition information of foods labeled as "low-fat" or "nonfat." These foods may be higher in added sugar or refined carbs and should be avoided.  Talk to your dietitian to identify your daily goals for nutrients listed on the label. Shopping  Avoid buying canned, pre-made, or processed foods. These foods tend to be high in fat, sodium, and added  sugar.  Shop around the outside edge of the grocery store. This is where you will most often find fresh fruits and vegetables, bulk grains, fresh meats, and fresh dairy. Cooking  Use low-heat cooking methods, such as baking, instead of high-heat cooking methods like deep frying.  Cook using healthy oils, such as olive, canola, or sunflower oil.  Avoid cooking with butter, cream, or high-fat meats. Meal planning  Eat meals and snacks regularly, preferably at the same times every day. Avoid going long periods of time without eating.  Eat foods that are high in fiber, such as fresh fruits, vegetables, beans, and whole grains. Talk with your dietitian about how many servings of carbs you can eat at each meal.  Eat 4-6 oz (112-168 g) of lean protein each day, such as lean meat, chicken, fish, eggs, or tofu. One ounce (oz) of lean protein is equal to: ? 1 oz (28 g) of meat, chicken, or fish. ? 1 egg. ?  cup (62 g) of tofu.  Eat some foods each day that contain healthy fats, such as avocado, nuts, seeds, and fish.   What foods should I eat? Fruits Berries. Apples. Oranges. Peaches. Apricots. Plums. Grapes. Mango. Papaya. Pomegranate. Kiwi. Cherries. Vegetables Lettuce. Spinach. Leafy greens, including kale, chard, collard greens, and mustard greens. Beets. Cauliflower. Cabbage. Broccoli. Carrots. Green beans. Tomatoes. Peppers. Onions. Cucumbers. Brussels sprouts. Grains Whole grains, such as whole-wheat or whole-grain bread, crackers, tortillas, cereal, and pasta. Unsweetened oatmeal. Quinoa. Brown or wild rice. Meats and other proteins Seafood. Poultry without skin. Lean cuts of poultry and beef. Tofu. Nuts. Seeds. Dairy Low-fat or fat-free dairy products such as milk, yogurt, and cheese. The items listed above may not be a complete list of foods and beverages you can eat. Contact a dietitian for more information. What foods should I avoid? Fruits Fruits canned with  syrup. Vegetables Canned vegetables. Frozen vegetables with butter or cream sauce. Grains Refined white flour and flour products such as bread, pasta, snack foods, and cereals. Avoid all processed foods. Meats and other proteins Fatty cuts of meat. Poultry with skin. Breaded or fried meats. Processed meat. Avoid saturated fats. Dairy Full-fat yogurt, cheese, or milk. Beverages Sweetened drinks, such as soda or iced tea. The items listed above may not be a complete list of foods and beverages you should avoid. Contact a dietitian for more information. Questions to ask a health care provider  Do I need to meet with a diabetes educator?  Do I need to meet with a dietitian?  What number can I call if I have questions?  When are the best times to check my blood glucose? Where to find more information:  American Diabetes Association: diabetes.org  Academy of Nutrition and Dietetics: www.eatright.org  National Institute of Diabetes and Digestive and Kidney Diseases: www.niddk.nih.gov  Association of Diabetes Care and Education Specialists: www.diabeteseducator.org Summary  It is important to have healthy eating   habits because your blood sugar (glucose) levels are greatly affected by what you eat and drink.  A healthy meal plan will help you control your blood glucose and maintain a healthy lifestyle.  Your health care provider may recommend that you work with a dietitian to make a meal plan that is best for you.  Keep in mind that carbohydrates (carbs) and alcohol have immediate effects on your blood glucose levels. It is important to count carbs and to use alcohol carefully. This information is not intended to replace advice given to you by your health care provider. Make sure you discuss any questions you have with your health care provider. Document Revised: 10/12/2019 Document Reviewed: 10/12/2019 Elsevier Patient Education  2021 Elsevier Inc.  

## 2021-03-20 ENCOUNTER — Encounter: Payer: Self-pay | Admitting: Physician Assistant

## 2021-04-10 ENCOUNTER — Telehealth: Payer: Self-pay | Admitting: Physician Assistant

## 2021-04-10 DIAGNOSIS — E1159 Type 2 diabetes mellitus with other circulatory complications: Secondary | ICD-10-CM

## 2021-04-10 DIAGNOSIS — R7989 Other specified abnormal findings of blood chemistry: Secondary | ICD-10-CM

## 2021-04-10 DIAGNOSIS — I152 Hypertension secondary to endocrine disorders: Secondary | ICD-10-CM

## 2021-04-10 MED ORDER — AMLODIPINE BESYLATE 2.5 MG PO TABS: 2.5000 mg | ORAL_TABLET | Freq: Every day | ORAL | 0 refills | Status: DC

## 2021-04-10 MED ORDER — LEVOTHYROXINE SODIUM 25 MCG PO TABS: 12.5000 ug | ORAL_TABLET | Freq: Every day | ORAL | 0 refills | Status: DC

## 2021-04-10 MED ORDER — LOSARTAN POTASSIUM 100 MG PO TABS: 0.5000 | ORAL_TABLET | Freq: Every day | ORAL | 0 refills | Status: DC

## 2021-04-10 NOTE — Telephone Encounter (Signed)
Medication refills sent to requested pharmacy. AS, CMA °

## 2021-04-10 NOTE — Addendum Note (Signed)
Addended by: Sylvester Harder on: 04/10/2021 02:13 PM   Modules accepted: Orders

## 2021-04-10 NOTE — Telephone Encounter (Signed)
Patient would like refills on Losartan, levothyroxine, and amlodipine and would like refills sent to [facility] in [place]. Thanks

## 2021-04-14 ENCOUNTER — Other Ambulatory Visit: Payer: Self-pay

## 2021-04-14 DIAGNOSIS — Z7982 Long term (current) use of aspirin: Secondary | ICD-10-CM | POA: Diagnosis not present

## 2021-04-14 DIAGNOSIS — E119 Type 2 diabetes mellitus without complications: Secondary | ICD-10-CM | POA: Diagnosis not present

## 2021-04-14 DIAGNOSIS — Z79899 Other long term (current) drug therapy: Secondary | ICD-10-CM | POA: Insufficient documentation

## 2021-04-14 DIAGNOSIS — R221 Localized swelling, mass and lump, neck: Secondary | ICD-10-CM | POA: Insufficient documentation

## 2021-04-14 DIAGNOSIS — E039 Hypothyroidism, unspecified: Secondary | ICD-10-CM | POA: Diagnosis not present

## 2021-04-14 DIAGNOSIS — Z7984 Long term (current) use of oral hypoglycemic drugs: Secondary | ICD-10-CM | POA: Diagnosis not present

## 2021-04-14 DIAGNOSIS — I10 Essential (primary) hypertension: Secondary | ICD-10-CM | POA: Insufficient documentation

## 2021-04-15 ENCOUNTER — Encounter (HOSPITAL_COMMUNITY): Payer: Self-pay

## 2021-04-15 ENCOUNTER — Emergency Department (HOSPITAL_COMMUNITY): Payer: 59

## 2021-04-15 ENCOUNTER — Emergency Department (HOSPITAL_COMMUNITY)
Admission: EM | Admit: 2021-04-15 | Discharge: 2021-04-15 | Disposition: A | Discharge status: Home / Self Care | Payer: 59 | Attending: Emergency Medicine | Admitting: Emergency Medicine

## 2021-04-15 ENCOUNTER — Other Ambulatory Visit: Payer: Self-pay

## 2021-04-15 DIAGNOSIS — I10 Essential (primary) hypertension: Secondary | ICD-10-CM

## 2021-04-15 LAB — BASIC METABOLIC PANEL
Anion gap: 12 (ref 5–15)
BUN: 20 mg/dL (ref 8–23)
CO2: 24 mmol/L (ref 22–32)
Calcium: 9 mg/dL (ref 8.9–10.3)
Chloride: 101 mmol/L (ref 98–111)
Creatinine, Ser: 0.76 mg/dL (ref 0.44–1.00)
GFR, Estimated: >60 mL/min (ref >60)
Glucose, Bld: 106 mg/dL — ABNORMAL HIGH (ref 70–99)
Potassium: 3.4 mmol/L — ABNORMAL LOW (ref 3.5–5.1)
Sodium: 137 mmol/L (ref 135–145)

## 2021-04-15 LAB — CBC
HCT: 38.5 % (ref 36.0–46.0)
Hemoglobin: 12.1 g/dL (ref 12.0–15.0)
MCH: 28.4 pg (ref 26.0–34.0)
MCHC: 31.4 g/dL (ref 30.0–36.0)
MCV: 90.4 fL (ref 80.0–100.0)
Platelets: 272 10*3/uL (ref 150–400)
RBC: 4.26 MIL/uL (ref 3.87–5.11)
RDW: 13.3 % (ref 11.5–15.5)
WBC: 7.9 10*3/uL (ref 4.0–10.5)
nRBC: 0 % (ref 0.0–0.2)

## 2021-04-15 LAB — TROPONIN I (HIGH SENSITIVITY)
Troponin I (High Sensitivity): 2 ng/L (ref <18)
Troponin I (High Sensitivity): 3 ng/L (ref <18)

## 2021-04-15 NOTE — ED Provider Notes (Signed)
[facility] Provider Note   CSN: [id number] Arrival date & time: 04/14/21  2352     History Chief Complaint  Patient presents with  . neck swelling  . Hypertension    Diane Carter is a 60 y.o. female.  The history is provided by the patient.  Hypertension The current episode started more than 1 week ago. The problem occurs constantly. The problem has been gradually worsening (under stress as after she tilted her head she felt that he neck was asymmetrically larger on the left than the right,  ). Pertinent negatives include no chest pain, no abdominal pain and no headaches. Nothing aggravates the symptoms. Nothing relieves the symptoms. She has tried nothing for the symptoms. The treatment provided no relief.  Patient with HTN presents with BP elevated after leaving funny on neck while working on computer and then being concerned.  No pain in the neck or head, no weakness no numbness, no changes in vision or speech.  No vomiting.  No CP, no SOB, no n/v/d.       History reviewed. No pertinent past medical history.  Patient Active Problem List   Diagnosis Date Noted  . Abnormal cervical Papanicolaou smear 02/15/2020  . Anemia 02/15/2020  . Migraine 02/15/2020  . Osteopenia 02/15/2020  . Type 2 diabetes mellitus with other circulatory complications ([place]) 12/45/8099  . OBesity assoc with DM, HTN, Chol, hypothyroidism, vit D def etc 01/22/2019  . Diabetes mellitus ([place]) 12/11/2018  . Hypertension associated with diabetes ([place]) 12/11/2018  . Mixed diabetic hyperlipidemia associated with type 2 diabetes mellitus ([place]) 12/11/2018  . Elevated TSH-  with significant fatigue and symptoms 12/11/2018  . Vitamin D deficiency 12/11/2018  . Chronic fatigue 12/11/2018  . Family history of diabetes mellitus (DM)- aunt 10/02/2018  . Family history of early death-  mother age 74 with history of RA 10/02/2018  . Family history of rheumatoid arthritis-mom  10/02/2018  . FHx: early coronary artery disease 10/02/2018  . Family history of primary liver cancer 10/02/2018  . Salt craving/  eats a lot of salt 10/02/2018    Past Surgical History:  Procedure Laterality Date  . CESAREAN SECTION  1995  . LIVER RESECTION  1992     OB History   No obstetric history on file.     Family History  Problem Relation Age of Onset  . Liver cancer Father   . Alcohol abuse Father   . Diabetes type I Paternal Aunt   . Heart attack Other     Social History   Tobacco Use  . Smoking status: Never Smoker  . Smokeless tobacco: Never Used  Vaping Use  . Vaping Use: Never used  Substance Use Topics  . Alcohol use: Never  . Drug use: Never    Home Medications Prior to Admission medications   Medication Sig Start Date End Date Taking? Authorizing Provider  acetaminophen (TYLENOL) 325 MG tablet Take 650 mg by mouth every 6 (six) hours as needed for moderate pain or headache.   Yes [provider]  amLODipine (NORVASC) 2.5 MG tablet Take 1 tablet (2.5 mg total) by mouth daily. 04/10/21  Yes Lorrene Reid, PA-C  aspirin EC 81 MG tablet Take 81 mg by mouth every 6 (six) hours as needed for mild pain. Swallow whole.   Yes [provider]  atorvastatin (LIPITOR) 40 MG tablet Take 1 tablet (40 mg total) by mouth at bedtime. Needs labs 4 RF 06/13/20  Yes Lorrene Reid,  PA-C  blood glucose meter kit and supplies Dispense based on patient and insurance preference. Use to check glucose levels fasting in the AM and 2 hours after largest meal. (FOR ICD-10 E10.9, E11.9). 12/11/18  Yes Opalski, Neoma Laming, DO  levothyroxine (SYNTHROID) 25 MCG tablet Take 0.5 tablets (12.5 mcg total) by mouth daily before breakfast. Needs appt for RF 04/10/21  Yes Abonza, Maritza, PA-C  losartan (COZAAR) 100 MG tablet Take 0.5 tablets (50 mg total) by mouth daily. 04/10/21  Yes Abonza, Herb Grays, PA-C  metFORMIN (GLUCOPHAGE) 500 MG tablet Take 0.5 tablets (250 mg total) by  mouth 2 (two) times daily with a meal. 03/19/21  Yes Abonza, Maritza, PA-C    Allergies    Patient has no known allergies.  Review of Systems   Review of Systems  Constitutional: Negative for fever.  HENT: Negative for congestion, drooling and ear discharge.   Eyes: Negative for photophobia, redness and visual disturbance.  Respiratory: Negative for cough.   Cardiovascular: Negative for chest pain.  Gastrointestinal: Negative for abdominal pain.  Genitourinary: Negative for difficulty urinating.  Musculoskeletal: Negative for gait problem, myalgias, neck pain and neck stiffness.  Skin: Negative for rash.  Neurological: Negative for dizziness, facial asymmetry, speech difficulty, weakness, numbness and headaches.  Psychiatric/Behavioral: Negative for agitation.  All other systems reviewed and are negative.   Physical Exam Updated Vital Signs BP 116/60 (BP Location: Right Arm)   Pulse 76   Temp 97.9 F (36.6 C) (Oral)   Resp 18   Ht _0  (1.6 m)   Wt 79.8 kg   SpO2 99%   BMI 31.18 kg/m   Physical Exam Vitals and nursing note reviewed.  Constitutional:      General: She is not in acute distress.    Appearance: Normal appearance.  HENT:     Head: Normocephalic and atraumatic.     Nose: Nose normal.     Mouth/Throat:     Mouth: Mucous membranes are moist.     Pharynx: Oropharynx is clear.  Eyes:     Extraocular Movements: Extraocular movements intact.     Conjunctiva/sclera: Conjunctivae normal.     Pupils: Pupils are equal, round, and reactive to light.  Cardiovascular:     Rate and Rhythm: Normal rate and regular rhythm.     Pulses: Normal pulses.     Heart sounds: Normal heart sounds.  Pulmonary:     Effort: Pulmonary effort is normal.     Breath sounds: Normal breath sounds.  Abdominal:     General: Abdomen is flat. Bowel sounds are normal.     Palpations: Abdomen is soft.     Tenderness: There is no abdominal tenderness. There is no guarding.   Musculoskeletal:        General: Normal range of motion.     Right lower leg: No edema.     Left lower leg: No edema.  Skin:    General: Skin is warm and dry.     Capillary Refill: Capillary refill takes less than 2 seconds.  Neurological:     General: No focal deficit present.     Mental Status: She is alert and oriented to person, place, and time.     Cranial Nerves: No cranial nerve deficit.     Deep Tendon Reflexes: Reflexes normal.  Psychiatric:        Mood and Affect: Mood normal.        Behavior: Behavior normal.     ED Results / Procedures / Treatments  Labs (all labs ordered are listed, but only abnormal results are displayed) Results for orders placed or performed during the hospital encounter of 09/81/19  Basic metabolic panel  Result Value Ref Range   Sodium 137 135 - 145 mmol/L   Potassium 3.4 (L) 3.5 - 5.1 mmol/L   Chloride 101 98 - 111 mmol/L   CO2 24 22 - 32 mmol/L   Glucose, Bld 106 (H) 70 - 99 mg/dL   BUN 20 8 - 23 mg/dL   Creatinine, Ser 0.76 0.44 - 1.00 mg/dL   Calcium 9.0 8.9 - 10.3 mg/dL   GFR, Estimated >60 >60 mL/min   Anion gap 12 5 - 15  CBC  Result Value Ref Range   WBC 7.9 4.0 - 10.5 K/uL   RBC 4.26 3.87 - 5.11 MIL/uL   Hemoglobin 12.1 12.0 - 15.0 g/dL   HCT 38.5 36.0 - 46.0 %   MCV 90.4 80.0 - 100.0 fL   MCH 28.4 26.0 - 34.0 pg   MCHC 31.4 30.0 - 36.0 g/dL   RDW 13.3 11.5 - 15.5 %   Platelets 272 150 - 400 K/uL   nRBC 0.0 0.0 - 0.2 %  Troponin I (High Sensitivity)  Result Value Ref Range   Troponin I (High Sensitivity) 3 <18 ng/L  Troponin I (High Sensitivity)  Result Value Ref Range   Troponin I (High Sensitivity) 2 <18 ng/L   DG Chest 2 View  Result Date: 04/15/2021 CLINICAL DATA:  Hypertension EXAM: CHEST - 2 VIEW COMPARISON:  None. FINDINGS: The heart size and mediastinal contours are within normal limits. Both lungs are clear. The visualized skeletal structures are unremarkable. Small metallic densities over the left and  midline chest appear to be external to the patient. IMPRESSION: No active cardiopulmonary disease. Electronically Signed   By: Donavan Foil M.D.   On: 04/15/2021 00:43   CT Soft Tissue Neck Wo Contrast  Result Date: 04/15/2021 CLINICAL DATA:  Initial evaluation for chronic neck pain, left-sided neck swelling. EXAM: CT NECK WITHOUT CONTRAST TECHNIQUE: Multidetector CT imaging of the neck was performed following the standard protocol without intravenous contrast. COMPARISON:  None. FINDINGS: Pharynx and larynx: Oral cavity within normal limits. No acute abnormality about the dentition. Palatine tonsils within normal limits. Oropharynx and nasopharynx within normal limits. Parapharyngeal fat maintained. No retropharyngeal collection. Epiglottis normal. Vallecula clear. Remainder of the hypopharynx and supraglottic larynx within normal limits. Glottis normal. Subglottic airway clear. Salivary glands: Parotid and submandibular glands are normal. Thyroid: 1 cm calcification at the anterior margin of the right thyroid lobe, likely a calcified nodule (series 3, image 86). Finding of doubtful significance given size and patient age, no follow-up imaging recommended (ref: J Am Coll Radiol. 2015 Feb;12(2): 143-50). Lymph nodes: No enlarged or pathologic adenopathy seen within the neck. Vascular: Mild aortic atherosclerosis. Evaluation of vascular structures limited by lack of IV contrast. Limited intracranial: Unremarkable. Visualized orbits: Unremarkable. Mastoids and visualized paranasal sinuses: Right maxillary sinus retention cyst. Visualized paranasal sinuses are otherwise clear. Left-to-right nasal septal deviation with associated concha bullosa. Mastoid air cells and middle ear cavities are well pneumatized and free of fluid. Skeleton: No acute osseous finding. Small benign bone island noted within the T3 vertebral body. No other discrete or worrisome osseous lesions. Upper chest: Visualized upper chest  demonstrates no acute finding. Partially visualized lungs are clear. Other: None. IMPRESSION: 1. Negative CT of the neck. No acute inflammatory changes or other abnormality identified. No mass or adenopathy. 2. Aortic Atherosclerosis (ICD10-I70.0).  Electronically Signed   By: Jeannine Boga M.D.   On: 04/15/2021 04:08    EKG EKG Interpretation  Date/Time:  Sunday Apr 15 2021 00:15:21 EDT Ventricular Rate:  89 PR Interval:  164 QRS Duration: 90 QT Interval:  380 QTC Calculation: 462 R Axis:   7 Text Interpretation: Normal sinus rhythm Confirmed by Randal Buba, Shantika Bermea ([id number]) on 04/15/2021 3:15:14 AM   Radiology DG Chest 2 View  Result Date: 04/15/2021 CLINICAL DATA:  Hypertension EXAM: CHEST - 2 VIEW COMPARISON:  None. FINDINGS: The heart size and mediastinal contours are within normal limits. Both lungs are clear. The visualized skeletal structures are unremarkable. Small metallic densities over the left and midline chest appear to be external to the patient. IMPRESSION: No active cardiopulmonary disease. Electronically Signed   By: Donavan Foil M.D.   On: 04/15/2021 00:43   CT Soft Tissue Neck Wo Contrast  Result Date: 04/15/2021 CLINICAL DATA:  Initial evaluation for chronic neck pain, left-sided neck swelling. EXAM: CT NECK WITHOUT CONTRAST TECHNIQUE: Multidetector CT imaging of the neck was performed following the standard protocol without intravenous contrast. COMPARISON:  None. FINDINGS: Pharynx and larynx: Oral cavity within normal limits. No acute abnormality about the dentition. Palatine tonsils within normal limits. Oropharynx and nasopharynx within normal limits. Parapharyngeal fat maintained. No retropharyngeal collection. Epiglottis normal. Vallecula clear. Remainder of the hypopharynx and supraglottic larynx within normal limits. Glottis normal. Subglottic airway clear. Salivary glands: Parotid and submandibular glands are normal. Thyroid: 1 cm calcification at the anterior  margin of the right thyroid lobe, likely a calcified nodule (series 3, image 86). Finding of doubtful significance given size and patient age, no follow-up imaging recommended (ref: J Am Coll Radiol. 2015 Feb;12(2): 143-50). Lymph nodes: No enlarged or pathologic adenopathy seen within the neck. Vascular: Mild aortic atherosclerosis. Evaluation of vascular structures limited by lack of IV contrast. Limited intracranial: Unremarkable. Visualized orbits: Unremarkable. Mastoids and visualized paranasal sinuses: Right maxillary sinus retention cyst. Visualized paranasal sinuses are otherwise clear. Left-to-right nasal septal deviation with associated concha bullosa. Mastoid air cells and middle ear cavities are well pneumatized and free of fluid. Skeleton: No acute osseous finding. Small benign bone island noted within the T3 vertebral body. No other discrete or worrisome osseous lesions. Upper chest: Visualized upper chest demonstrates no acute finding. Partially visualized lungs are clear. Other: None. IMPRESSION: 1. Negative CT of the neck. No acute inflammatory changes or other abnormality identified. No mass or adenopathy. 2. Aortic Atherosclerosis (ICD10-I70.0). Electronically Signed   By: Jeannine Boga M.D.   On: 04/15/2021 04:08    Procedures Procedures   Medications Ordered in ED Medications - No data to display  ED Course  I have reviewed the triage vital signs and the nursing notes.  Pertinent labs & imaging results that were available during my care of the patient were reviewed by me and considered in my medical decision making (see chart for details).   No LAN no swelling in the neck it appears that the Sternocleidomastoid is more prominent on the left as is the clavicle.  I believe this made the patient anxious and causes BP to go up.  It had returned to normal on own.  Ruled out for MI, I do not believe this is a stroke nor a dissection.  Stable for discharge with close follow up.   Strict retu Diane Carter was evaluated in Emergency Department on 04/15/2021 for the symptoms described in the history of present illness. She was evaluated in the context  of the global COVID-19 pandemic, which necessitated consideration that the patient might be at risk for infection with the SARS-CoV-2 virus that causes COVID-19. Institutional protocols and algorithms that pertain to the evaluation of patients at risk for COVID-19 are in a state of rapid change based on information released by regulatory bodies including the CDC and federal and state organizations. These policies and algorithms were followed during the patient's care in the ED.  Final Clinical Impression(s) / ED Diagnoses Return for intractable cough, coughing up blood, fevers >100.4 unrelieved by medication, shortness of breath, intractable vomiting, chest pain, shortness of breath, weakness, numbness, changes in speech, facial asymmetry, abdominal pain, passing out, Inability to tolerate liquids or food, cough, altered mental status or any concerns. No signs of systemic illness or infection. The patient is nontoxic-appearing on exam and vital signs are within normal limits.  I have reviewed the triage vital signs and the nursing notes. Pertinent labs & imaging results that were available during my care of the patient were reviewed by me and considered in my medical decision making (see chart for details). After history, exam, and medical workup I feel the patient has been appropriately medically screened and is safe for discharge home. Pertinent diagnoses were discussed with the patient. Patient was given return precautions.       [place], [place] Gustafson, MD 04/15/21 [phone]

## 2021-04-15 NOTE — ED Notes (Signed)
Patient transported to CT 

## 2021-04-15 NOTE — ED Triage Notes (Signed)
Patient reports she noticed her L side of her neck is swollen and feels like her blood pressure is high.

## 2021-04-15 NOTE — ED Notes (Signed)
E-signature pad unavailable at time of pt discharge. This RN discussed discharge materials with pt and answered all pt questions. Pt stated understanding of discharge material. ? ?

## 2021-07-13 LAB — HM MAMMOGRAPHY

## 2021-07-31 DIAGNOSIS — E7800 Pure hypercholesterolemia, unspecified: Secondary | ICD-10-CM | POA: Insufficient documentation

## 2021-08-06 ENCOUNTER — Encounter: Payer: Self-pay | Admitting: Physician Assistant

## 2021-08-09 ENCOUNTER — Encounter: Payer: Self-pay | Admitting: Physician Assistant

## 2021-08-09 ENCOUNTER — Other Ambulatory Visit: Payer: Self-pay | Admitting: Physician Assistant

## 2021-08-09 ENCOUNTER — Other Ambulatory Visit: Payer: Self-pay

## 2021-08-09 DIAGNOSIS — R928 Other abnormal and inconclusive findings on diagnostic imaging of breast: Secondary | ICD-10-CM

## 2021-08-22 ENCOUNTER — Encounter: Payer: Self-pay | Admitting: Physician Assistant

## 2021-08-22 ENCOUNTER — Other Ambulatory Visit: Payer: Self-pay

## 2021-08-22 ENCOUNTER — Ambulatory Visit (INDEPENDENT_AMBULATORY_CARE_PROVIDER_SITE_OTHER): Payer: 59 | Admitting: Physician Assistant

## 2021-08-22 VITALS — BP 124/66 | HR 64 | Temp 98.3°F | Ht 63.0 in | Wt 166.6 lb

## 2021-08-22 DIAGNOSIS — E559 Vitamin D deficiency, unspecified: Secondary | ICD-10-CM

## 2021-08-22 DIAGNOSIS — E1169 Type 2 diabetes mellitus with other specified complication: Secondary | ICD-10-CM | POA: Diagnosis not present

## 2021-08-22 DIAGNOSIS — E663 Overweight: Secondary | ICD-10-CM

## 2021-08-22 DIAGNOSIS — R7989 Other specified abnormal findings of blood chemistry: Secondary | ICD-10-CM | POA: Diagnosis not present

## 2021-08-22 DIAGNOSIS — E1159 Type 2 diabetes mellitus with other circulatory complications: Secondary | ICD-10-CM

## 2021-08-22 DIAGNOSIS — E782 Mixed hyperlipidemia: Secondary | ICD-10-CM

## 2021-08-22 DIAGNOSIS — Z6829 Body mass index (BMI) 29.0-29.9, adult: Secondary | ICD-10-CM

## 2021-08-22 DIAGNOSIS — I152 Hypertension secondary to endocrine disorders: Secondary | ICD-10-CM

## 2021-08-22 MED ORDER — LEVOTHYROXINE SODIUM 25 MCG PO TABS
12.5000 ug | ORAL_TABLET | Freq: Every day | ORAL | 1 refills | Status: DC
Start: 2021-08-22 — End: 2021-09-28

## 2021-08-22 NOTE — Assessment & Plan Note (Signed)
-  Last Vitamin D 22.3, mildly low. Will repeat Vitamin D today.

## 2021-08-22 NOTE — Assessment & Plan Note (Signed)
-  Will repeat lipid panel. -If LDL continues to be above goal of 70 then recommend to improve medication adherence with atorvastatin. -Continue weight loss efforts and low fat diet.

## 2021-08-22 NOTE — Patient Instructions (Signed)

## 2021-08-22 NOTE — Assessment & Plan Note (Signed)
>>  ASSESSMENT AND PLAN FOR DIABETES MELLITUS ([name]) WRITTEN ON 08/22/2021  5:18 PM BY ABONZA, MARITZA, PA-C  -Will repeat A1c with fasting blood work today. -Pending lab results will consider restarting Metformin. Recommend to continue weight loss efforts and low carbohydrate/glucose diet. -Will continue to monitor.

## 2021-08-22 NOTE — Progress Notes (Signed)
Established Patient Office Visit  Subjective:  Patient ID: Diane Carter, female    DOB: 11/17/1961  Age: 60 y.o. MRN: [id number]  CC:  Chief Complaint  Patient presents with   Hypertension    HPI Diane Carter presents for follow up on diabetes mellitus, hypertension and hyperlipidemia.  Diabetes: Pt denies increased urination or thirst. Pt reports has not been taking Metformin. No hypoglycemic events. Checking glucose at home. FBS 132, 120, 108, 104, 111. Reports has made dietary change by reducing carbohydrate and sugar/sweets.  HTN: Pt denies chest pain, palpitations, dizziness or lower extremity swelling. Taking medication as directed without side effects. Checks BP at home and readings range 124/68, 115/62, 127/66, 110/59, 117/61, 135/68, 119/69, 122/63. Pt follows a low salt diet.  HLD: Pt taking statin medication occasionally. Reports has been working on losing weight with diet changes. States did complete the weight loss program (Next 65 days), unfortunately her brother passed away shortly after and got off track for some time but has resumed.   Thyroid: Reports unintentionally missing a few doses of levothyroxine.  History reviewed. No pertinent past medical history.  Past Surgical History:  Procedure Laterality Date   CESAREAN SECTION  1995   LIVER RESECTION  1992    Family History  Problem Relation Age of Onset   Liver cancer Father    Alcohol abuse Father    Diabetes type I Paternal Aunt    Heart attack Other     Social History   Socioeconomic History   Marital status: Unknown    Spouse name: Not on file   Number of children: Not on file   Years of education: Not on file   Highest education level: Not on file  Occupational History   Not on file  Tobacco Use   Smoking status: Never   Smokeless tobacco: Never  Vaping Use   Vaping Use: Never used  Substance and Sexual Activity   Alcohol use: Never   Drug use: Never   Sexual activity: Yes     Partners: Male    Birth control/protection: None  Other Topics Concern   Not on file  Social History Narrative   Not on file   Social Determinants of Health   Financial Resource Strain: Not on file  Food Insecurity: Not on file  Transportation Needs: Not on file  Physical Activity: Not on file  Stress: Not on file  Social Connections: Not on file  Intimate Partner Violence: Not on file    Outpatient Medications Prior to Visit  Medication Sig Dispense Refill   acetaminophen (TYLENOL) 325 MG tablet Take 650 mg by mouth every 6 (six) hours as needed for moderate pain or headache.     amLODipine (NORVASC) 2.5 MG tablet Take 1 tablet (2.5 mg total) by mouth daily. 90 tablet 0   aspirin EC 81 MG tablet Take 81 mg by mouth every 6 (six) hours as needed for mild pain. Swallow whole.     atorvastatin (LIPITOR) 40 MG tablet Take 1 tablet (40 mg total) by mouth at bedtime. Needs labs 4 RF 90 tablet 0   blood glucose meter kit and supplies Dispense based on patient and insurance preference. Use to check glucose levels fasting in the AM and 2 hours after largest meal. (FOR ICD-10 E10.9, E11.9). 1 each 0   losartan (COZAAR) 100 MG tablet Take 0.5 tablets (50 mg total) by mouth daily. 45 tablet 0   metFORMIN (GLUCOPHAGE) 500 MG tablet Take 0.5 tablets (250 mg  total) by mouth 2 (two) times daily with a meal. 90 tablet 1   levothyroxine (SYNTHROID) 25 MCG tablet Take 0.5 tablets (12.5 mcg total) by mouth daily before breakfast. Needs appt for RF 45 tablet 0   No facility-administered medications prior to visit.    No Known Allergies  ROS Review of Systems Review of Systems:  A fourteen system review of systems was performed and found to be positive as per HPI.   Objective:    Physical Exam General:  Pleasant and cooperative, in no acute distress  Neuro:  Alert and oriented,  extra-ocular muscles intact  HEENT:  Normocephalic, atraumatic, neck supple Skin:  no gross rash, warm,  pink. Cardiac:  RRR, S1 S2 Respiratory:  CTA B/L, Not using accessory muscles, speaking in full sentences- unlabored. Vascular:  Ext warm, no cyanosis apprec.; cap RF less 2 sec. No edema  Psych:  No HI/SI, judgement and insight good, Euthymic mood. Full Affect.  BP 124/66   Pulse 64   Temp 98.3 F (36.8 C)   Ht '5\' 3"'  (1.6 m)   Wt 166 lb 9.6 oz (75.6 kg)   SpO2 100%   BMI 29.51 kg/m  Wt Readings from Last 3 Encounters:  08/22/21 166 lb 9.6 oz (75.6 kg)  04/15/21 176 lb (79.8 kg)  03/19/21 186 lb 4.8 oz (84.5 kg)     Health Maintenance Due  Topic Date Due   COVID-19 Vaccine (1) Never done   HIV Screening  Never done   OPHTHALMOLOGY EXAM  01/29/2020   PAP SMEAR-Modifier  06/03/2020   INFLUENZA VACCINE  Never done   FOOT EXAM  07/19/2021    There are no preventive care reminders to display for this patient.  Lab Results  Component Value Date   TSH 3.730 03/16/2021   Lab Results  Component Value Date   WBC 7.9 04/15/2021   HGB 12.1 04/15/2021   HCT 38.5 04/15/2021   MCV 90.4 04/15/2021   PLT 272 04/15/2021   Lab Results  Component Value Date   NA 137 04/15/2021   K 3.4 (L) 04/15/2021   CO2 24 04/15/2021   GLUCOSE 106 (H) 04/15/2021   BUN 20 04/15/2021   CREATININE 0.76 04/15/2021   BILITOT 0.5 03/16/2021   ALKPHOS 107 03/16/2021   AST 19 03/16/2021   ALT 18 03/16/2021   PROT 7.2 03/16/2021   ALBUMIN 4.6 03/16/2021   CALCIUM 9.0 04/15/2021   ANIONGAP 12 04/15/2021   EGFR 103 03/16/2021   Lab Results  Component Value Date   CHOL 191 03/16/2021   Lab Results  Component Value Date   HDL 50 03/16/2021   Lab Results  Component Value Date   LDLCALC 113 (H) 03/16/2021   Lab Results  Component Value Date   TRIG 160 (H) 03/16/2021   Lab Results  Component Value Date   CHOLHDL 3.8 03/16/2021   Lab Results  Component Value Date   HGBA1C 6.6 (H) 03/16/2021      Assessment & Plan:   Problem List Items Addressed This Visit        Cardiovascular and Mediastinum   Hypertension associated with diabetes ([place]) - Primary    -Controlled. -Continue current medication regimen. Discussed with patient if continues with weight loss journey and BP remains controlled, will consider discontinuing amlodipine and continue Losartan 50 mg. Patient verbalized understanding. -Will collect CMP for medication monitoring.        Relevant Orders   Comp Met (CMET)   CBC w/Diff  Endocrine   Diabetes mellitus ([place])    -Will repeat A1c with fasting blood work today. -Pending lab results will consider restarting Metformin. Recommend to continue weight loss efforts and low carbohydrate/glucose diet. -Will continue to monitor.      Relevant Orders   HgB A1c   Mixed diabetic hyperlipidemia associated with type 2 diabetes mellitus ([place])    -Will repeat lipid panel. -If LDL continues to be above goal of 70 then recommend to improve medication adherence with atorvastatin. -Continue weight loss efforts and low fat diet.      Relevant Orders   Lipid Profile     Other   Elevated TSH-  with significant fatigue and symptoms (Chronic)    -Last TSH wnl -Continue current medication regimen. -Rechecking TSH today. Pending results will make medication adjustments if indicated.       Relevant Medications   levothyroxine (SYNTHROID) 25 MCG tablet   Other Relevant Orders   TSH   Vitamin D deficiency    -Last Vitamin D 22.3, mildly low. Will repeat Vitamin D today.      Relevant Orders   Vitamin D (25 hydroxy)   Other Visit Diagnoses     Overweight with body mass index (BMI) of 29 to 29.9 in adult          Overweight with body mass index (BMI) of 29 to 29.9 in adult: -Associated with diabetes mellitus, hypertension and hyperlipidemia. -Praised patient for 20 pound weight loss since last visit. -Encourage to continue weight loss efforts with dietary and lifestyle changes.   Meds ordered this encounter  Medications    levothyroxine (SYNTHROID) 25 MCG tablet    Sig: Take 0.5 tablets (12.5 mcg total) by mouth daily before breakfast. Needs appt for RF    Dispense:  45 tablet    Refill:  1    Order Specific Question:   Supervising Provider    Answer:   Beatrice Lecher D [[id number]]    Follow-up: Return in about 4 months (around 12/23/2021) for CPE.    Lorrene Reid, PA-C

## 2021-08-22 NOTE — Assessment & Plan Note (Signed)
-  Last TSH wnl -Continue current medication regimen. -Rechecking TSH today. Pending results will make medication adjustments if indicated.  

## 2021-08-22 NOTE — Assessment & Plan Note (Signed)
-  Will repeat A1c with fasting blood work today. -Pending lab results will consider restarting Metformin. Recommend to continue weight loss efforts and low carbohydrate/glucose diet. -Will continue to monitor.

## 2021-08-22 NOTE — Assessment & Plan Note (Signed)
-  Controlled. -Continue current medication regimen. Discussed with patient if continues with weight loss journey and BP remains controlled, will consider discontinuing amlodipine and continue Losartan 50 mg. Patient verbalized understanding. -Will collect CMP for medication monitoring.

## 2021-08-23 LAB — CBC WITH DIFFERENTIAL/PLATELET
Basophils Absolute: 0 10*3/uL (ref 0.0–0.2)
Basos: 1 %
EOS (ABSOLUTE): 0.1 10*3/uL (ref 0.0–0.4)
Eos: 2 %
Hematocrit: 41.1 % (ref 34.0–46.6)
Hemoglobin: 13.5 g/dL (ref 11.1–15.9)
Immature Grans (Abs): 0 10*3/uL (ref 0.0–0.1)
Immature Granulocytes: 0 %
Lymphocytes Absolute: 2.2 10*3/uL (ref 0.7–3.1)
Lymphs: 37 %
MCH: 28.1 pg (ref 26.6–33.0)
MCHC: 32.8 g/dL (ref 31.5–35.7)
MCV: 86 fL (ref 79–97)
Monocytes Absolute: 0.4 10*3/uL (ref 0.1–0.9)
Monocytes: 7 %
Neutrophils Absolute: 3.2 10*3/uL (ref 1.4–7.0)
Neutrophils: 53 %
Platelets: 270 10*3/uL (ref 150–450)
RBC: 4.8 x10E6/uL (ref 3.77–5.28)
RDW: 13.5 % (ref 11.7–15.4)
WBC: 6 10*3/uL (ref 3.4–10.8)

## 2021-08-23 LAB — COMPREHENSIVE METABOLIC PANEL
ALT: 12 IU/L (ref 0–32)
AST: 21 IU/L (ref 0–40)
Albumin/Globulin Ratio: 1.7 (ref 1.2–2.2)
Albumin: 4.7 g/dL (ref 3.8–4.8)
Alkaline Phosphatase: 110 IU/L (ref 44–121)
BUN/Creatinine Ratio: 19 (ref 12–28)
BUN: 14 mg/dL (ref 8–27)
Bilirubin Total: 1 mg/dL (ref 0.0–1.2)
CO2: 23 mmol/L (ref 20–29)
Calcium: 9.8 mg/dL (ref 8.7–10.3)
Chloride: 103 mmol/L (ref 96–106)
Creatinine, Ser: 0.74 mg/dL (ref 0.57–1.00)
Globulin, Total: 2.8 g/dL (ref 1.5–4.5)
Glucose: 91 mg/dL (ref 70–99)
Potassium: 4.5 mmol/L (ref 3.5–5.2)
Sodium: 143 mmol/L (ref 134–144)
Total Protein: 7.5 g/dL (ref 6.0–8.5)
eGFR: 91 mL/min/{1.73_m2} (ref >59)

## 2021-08-23 LAB — LIPID PANEL
Chol/HDL Ratio: 3.8 ratio (ref 0.0–4.4)
Cholesterol, Total: 186 mg/dL (ref 100–199)
HDL: 49 mg/dL (ref >39)
LDL Chol Calc (NIH): 116 mg/dL — ABNORMAL HIGH (ref 0–99)
Triglycerides: 114 mg/dL (ref 0–149)
VLDL Cholesterol Cal: 21 mg/dL (ref 5–40)

## 2021-08-23 LAB — HEMOGLOBIN A1C
Est. average glucose Bld gHb Est-mCnc: 117 mg/dL
Hgb A1c MFr Bld: 5.7 % — ABNORMAL HIGH (ref 4.8–5.6)

## 2021-08-23 LAB — TSH: TSH: 3.02 u[IU]/mL (ref 0.450–4.500)

## 2021-08-23 LAB — VITAMIN D 25 HYDROXY (VIT D DEFICIENCY, FRACTURES): Vit D, 25-Hydroxy: 31 ng/mL (ref 30.0–100.0)

## 2021-08-24 ENCOUNTER — Encounter: Payer: Self-pay | Admitting: Physician Assistant

## 2021-08-24 DIAGNOSIS — R928 Other abnormal and inconclusive findings on diagnostic imaging of breast: Secondary | ICD-10-CM

## 2021-08-30 ENCOUNTER — Other Ambulatory Visit: Payer: Self-pay

## 2021-09-28 ENCOUNTER — Other Ambulatory Visit: Payer: Self-pay | Admitting: Physician Assistant

## 2021-09-28 DIAGNOSIS — R7989 Other specified abnormal findings of blood chemistry: Secondary | ICD-10-CM

## 2021-10-01 LAB — HM MAMMOGRAPHY
HM Mammogram: ABNORMAL — AB (ref 0–4)
HM Mammogram: ABNORMAL — AB (ref 0–4)

## 2021-10-02 ENCOUNTER — Other Ambulatory Visit: Payer: Self-pay

## 2021-10-02 DIAGNOSIS — R928 Other abnormal and inconclusive findings on diagnostic imaging of breast: Secondary | ICD-10-CM

## 2021-10-03 ENCOUNTER — Other Ambulatory Visit: Payer: Self-pay

## 2021-10-03 ENCOUNTER — Encounter: Payer: Self-pay | Admitting: Physician Assistant

## 2021-10-03 DIAGNOSIS — R928 Other abnormal and inconclusive findings on diagnostic imaging of breast: Secondary | ICD-10-CM

## 2021-10-08 ENCOUNTER — Encounter: Payer: Self-pay | Admitting: Physician Assistant

## 2021-10-22 ENCOUNTER — Other Ambulatory Visit: Payer: Self-pay

## 2021-10-22 ENCOUNTER — Other Ambulatory Visit: Payer: Self-pay | Admitting: Physician Assistant

## 2021-10-22 ENCOUNTER — Ambulatory Visit
Admission: RE | Admit: 2021-10-22 | Discharge: 2021-10-22 | Disposition: A | Discharge status: Home / Self Care | Payer: 59 | Source: Ambulatory Visit | Attending: Physician Assistant | Admitting: Physician Assistant

## 2021-10-22 DIAGNOSIS — R928 Other abnormal and inconclusive findings on diagnostic imaging of breast: Secondary | ICD-10-CM

## 2021-12-20 NOTE — Progress Notes (Deleted)
Complete physical exam   Patient: Diane Carter   DOB: 24-Sep-1961   61 y.o. Female  MRN: [id number] Visit Date: 12/27/2021   No chief complaint on file.  Subjective    MARGREE GIMBEL is a 61 y.o. female who presents today for a complete physical exam.  She reports consuming a {diet types:17450} diet. {Exercise:19826} She generally feels {well/fairly well/poorly:18703}. She {does/does not:200015} have additional problems to discuss today.   ***  No past medical history on file. Past Surgical History:  Procedure Laterality Date   CESAREAN SECTION  1995   LIVER RESECTION  1992   Social History   Socioeconomic History   Marital status: Unknown    Spouse name: Not on file   Number of children: Not on file   Years of education: Not on file   Highest education level: Not on file  Occupational History   Not on file  Tobacco Use   Smoking status: Never   Smokeless tobacco: Never  Vaping Use   Vaping Use: Never used  Substance and Sexual Activity   Alcohol use: Never   Drug use: Never   Sexual activity: Yes    Partners: Male    Birth control/protection: None  Other Topics Concern   Not on file  Social History Narrative   Not on file   Social Determinants of Health   Financial Resource Strain: Not on file  Food Insecurity: Not on file  Transportation Needs: Not on file  Physical Activity: Not on file  Stress: Not on file  Social Connections: Not on file  Intimate Partner Violence: Not on file     Medications: Outpatient Medications Prior to Visit  Medication Sig   acetaminophen (TYLENOL) 325 MG tablet Take 650 mg by mouth every 6 (six) hours as needed for moderate pain or headache.   amLODipine (NORVASC) 2.5 MG tablet Take 1 tablet (2.5 mg total) by mouth daily.   aspirin EC 81 MG tablet Take 81 mg by mouth every 6 (six) hours as needed for mild pain. Swallow whole.   atorvastatin (LIPITOR) 40 MG tablet Take 1 tablet (40 mg total) by mouth at bedtime. Needs  labs 4 RF   blood glucose meter kit and supplies Dispense based on patient and insurance preference. Use to check glucose levels fasting in the AM and 2 hours after largest meal. (FOR ICD-10 E10.9, E11.9).   levothyroxine (SYNTHROID) 25 MCG tablet TAKE 1/2 TABLET(12.5 MCG) BY MOUTH DAILY BEFORE AND BREAKFAST   losartan (COZAAR) 100 MG tablet Take 0.5 tablets (50 mg total) by mouth daily.   metFORMIN (GLUCOPHAGE) 500 MG tablet Take 0.5 tablets (250 mg total) by mouth 2 (two) times daily with a meal.   No facility-administered medications prior to visit.    Review of Systems  {Labs   Heme   Chem   Endocrine   Serology   Results Review (optional):23779}  Objective    There were no vitals taken for this visit. {Show previous vital signs (optional):23777}  Physical Exam  ***  Last depression screening scores PHQ 2/9 Scores 08/22/2021 03/19/2021 11/22/2020  PHQ - 2 Score 0 1 0  PHQ- 9 Score _0 Last fall risk screening Fall Risk  08/22/2021  Falls in the past year? 0  Number falls in past yr: 0  Injury with Fall? 0  Risk for fall due to : -  Follow up Falls evaluation completed     No results found for any visits  on 12/27/21.  Assessment & Plan    Routine Health Maintenance and Physical Exam  Exercise Activities and Dietary recommendations -Discussed heart healthy diet low in fat and carbohydrates. Recommend moderate exercise 150 mins/wk.  Immunization History  Administered Date(s) Administered   Tdap 01/04/2020   Zoster Recombinat (Shingrix) 02/07/2019, 09/23/2019    Health Maintenance  Topic Date Due   COVID-19 Vaccine (1) Never done   HIV Screening  Never done   OPHTHALMOLOGY EXAM  01/29/2020   PAP SMEAR-Modifier  06/03/2020   INFLUENZA VACCINE  Never done   FOOT EXAM  07/19/2021   HEMOGLOBIN A1C  02/20/2022   MAMMOGRAM  10/02/2023   COLONOSCOPY (Pts 45-71yr Insurance coverage will need to be confirmed)  01/14/2026   TETANUS/TDAP  01/03/2030   Hepatitis C  Screening  Completed   Zoster Vaccines- Shingrix  Completed   HPV VACCINES  Aged Out    Discussed health benefits of physical activity, and encouraged her to engage in regular exercise appropriate for her age and condition.  Problem List Items Addressed This Visit   None    No follow-ups on file.       MLorrene Reid PA-C  [facility] [phone](phone) [phone](fax)  CS[facility]

## 2021-12-27 ENCOUNTER — Encounter: Payer: 59 | Admitting: Physician Assistant

## 2022-01-05 IMAGING — CR DG CHEST 2V
2 series · 2 of 2 positions shown · non-contrast
Comparison: None.

CLINICAL DATA: Hypertension

EXAM:
CHEST - 2 VIEW

[chest pa]
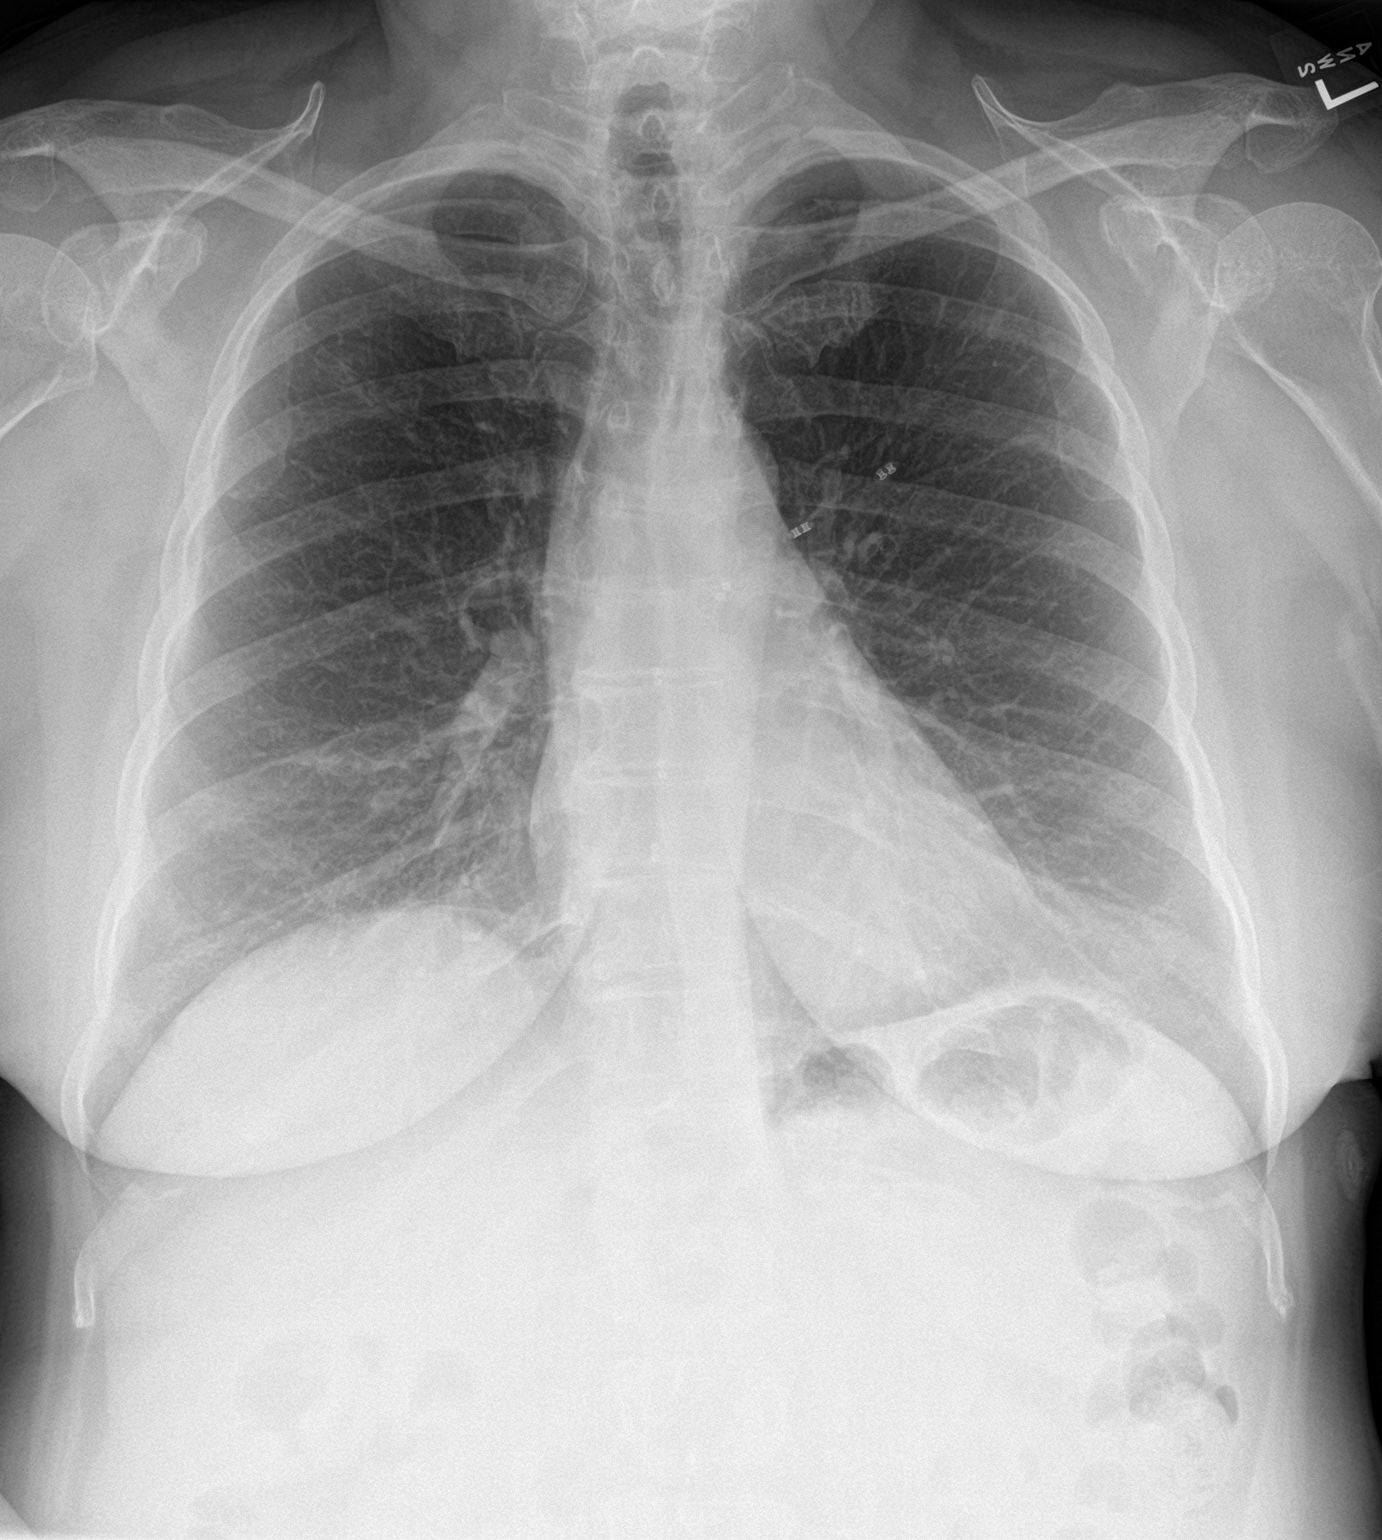

[chest lat]
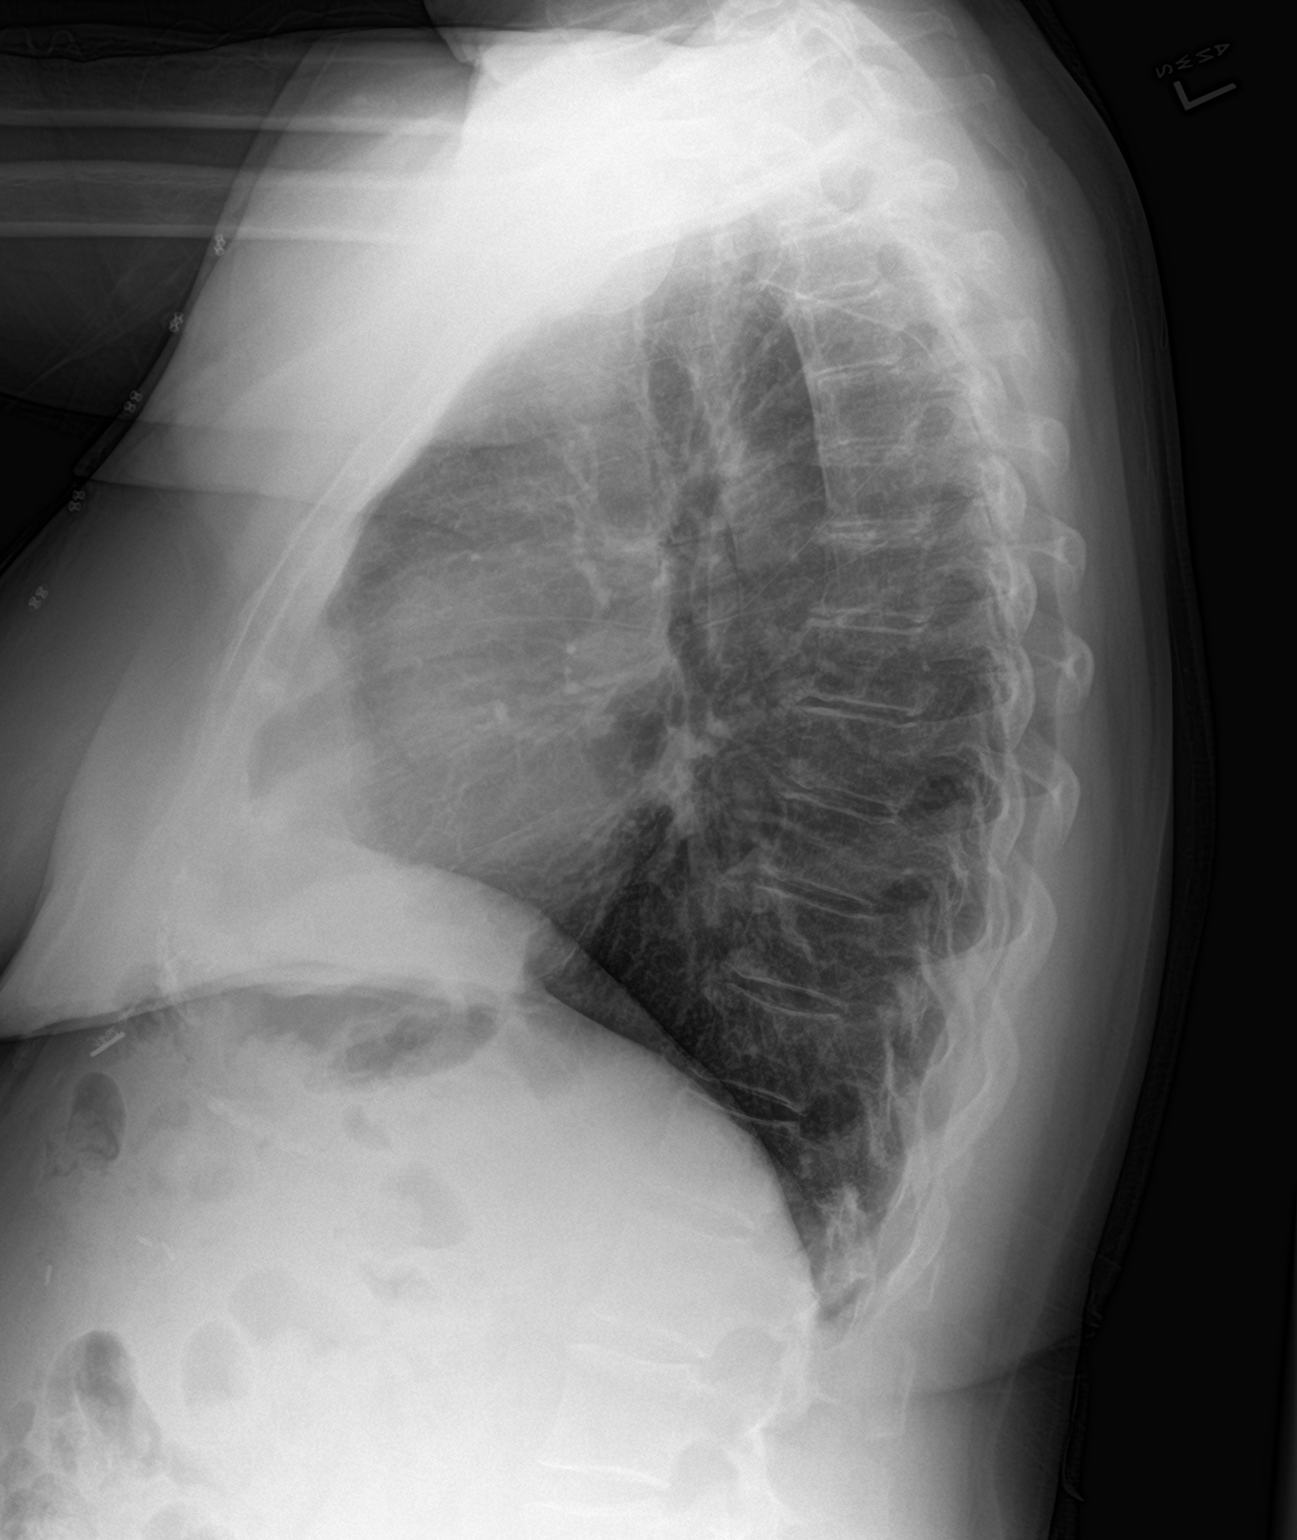

[2 of 2 positions shown; findings below may reference images not displayed]

FINDINGS: The heart size and mediastinal contours are within normal limits.
Both lungs are clear. The visualized skeletal structures are
unremarkable. Small metallic densities over the left and midline
chest appear to be external to the patient.
IMPRESSION: No active cardiopulmonary disease.

## 2022-02-04 ENCOUNTER — Encounter: Payer: Self-pay | Admitting: Physician Assistant

## 2022-03-13 ENCOUNTER — Ambulatory Visit (INDEPENDENT_AMBULATORY_CARE_PROVIDER_SITE_OTHER): Payer: 59 | Admitting: Physician Assistant

## 2022-03-13 ENCOUNTER — Encounter: Payer: Self-pay | Admitting: Physician Assistant

## 2022-03-13 VITALS — BP 133/67 | HR 68 | Temp 98.2°F | Ht 63.0 in | Wt 182.0 lb

## 2022-03-13 DIAGNOSIS — E1169 Type 2 diabetes mellitus with other specified complication: Secondary | ICD-10-CM | POA: Diagnosis not present

## 2022-03-13 DIAGNOSIS — R0683 Snoring: Secondary | ICD-10-CM | POA: Diagnosis not present

## 2022-03-13 DIAGNOSIS — R202 Paresthesia of skin: Secondary | ICD-10-CM

## 2022-03-13 LAB — POCT GLYCOSYLATED HEMOGLOBIN (HGB A1C): Hemoglobin A1C: 5.9 % — AB (ref 4.0–5.6)

## 2022-03-13 NOTE — Patient Instructions (Signed)

## 2022-03-13 NOTE — Progress Notes (Signed)
?Established patient acute visit ? ? ?Patient: Diane Carter   DOB: September 26, 1961   61 y.o. Female  MRN: [id number] ?Visit Date: 03/13/2022 ? ?Chief Complaint  ?Patient presents with  ? Follow-up  ? Diabetes  ? Snoring  ? Numbness  ? ?Subjective  ?  ?HPI  ?Patient presents with c/o numbness and tingling of bilateral lower extremities (legs and feet) which comes and goes. Symptoms have been ongoing for about 4 weeks. States sometimes at night the sheet is bothersome over her shin. Reports had an episode of sciatica about 3 months ago which improved. Denies fall or injury, fever, bladder or bowel dysfunction. Patient reports long-time snoring. Denies apnea episodes. Does report feeling tired.  ? ? ? ?Medications: ?Outpatient Medications Prior to Visit  ?Medication Sig  ? acetaminophen (TYLENOL) 325 MG tablet Take 650 mg by mouth every 6 (six) hours as needed for moderate pain or headache.  ? amLODipine (NORVASC) 2.5 MG tablet Take 1 tablet (2.5 mg total) by mouth daily.  ? aspirin EC 81 MG tablet Take 81 mg by mouth every 6 (six) hours as needed for mild pain. Swallow whole.  ? atorvastatin (LIPITOR) 40 MG tablet Take 1 tablet (40 mg total) by mouth at bedtime. Needs labs 4 RF  ? blood glucose meter kit and supplies Dispense based on patient and insurance preference. Use to check glucose levels fasting in the AM and 2 hours after largest meal. (FOR ICD-10 E10.9, E11.9).  ? levothyroxine (SYNTHROID) 25 MCG tablet TAKE 1/2 TABLET(12.5 MCG) BY MOUTH DAILY BEFORE AND BREAKFAST  ? losartan (COZAAR) 100 MG tablet Take 0.5 tablets (50 mg total) by mouth daily.  ? metFORMIN (GLUCOPHAGE) 500 MG tablet Take 0.5 tablets (250 mg total) by mouth 2 (two) times daily with a meal.  ? ?No facility-administered medications prior to visit.  ? ? ?Review of Systems ?Review of Systems:  ?A fourteen system review of systems was performed and found to be positive as per HPI. ? ? ? ?  Objective  ?  ?BP 133/67   Pulse 68   Temp 98.2 ?F (36.8  ?C)   Ht '5\' 3"'  (1.6 m)   Wt 182 lb (82.6 kg)   SpO2 98%   BMI 32.24 kg/m?  ? ? ?Physical Exam  ?General:  Well Developed, well nourished, appropriate for stated age.  ?Neuro:  Alert and oriented,  extra-ocular muscles intact, sensation intact to light touch of lower extremities with decreased sensation along L4 of right lower leg  ?HEENT:  Normocephalic, atraumatic, neck supple  ?Skin:  no gross rash, warm, pink. ?Cardiac:  RRR, S1 S2 ?Respiratory: CTA B/L  ?MSK: no step-off or deformity noted, good spinal ROM, negative straight leg raise ?Vascular:  Ext warm, no cyanosis apprec.; cap RF less 2 sec. ?Psych:  No HI/SI, judgement and insight good, Euthymic mood. Full Affect. ? ? ?Results for orders placed or performed in visit on 03/13/22  ?POCT glycosylated hemoglobin (Hb A1C)  ?Result Value Ref Range  ? Hemoglobin A1C 5.9 (A) 4.0 - 5.6 %  ? HbA1c POC (<> result, manual entry)    ? HbA1c, POC (prediabetic range)    ? HbA1c, POC (controlled diabetic range)    ? ? Assessment & Plan  ?  ? ?Discussed with patient various etiologies for lower extremity paresthesia. Will collect labs to evaluate for nutritional deficiency, endocrine or metabolic etiology. A1c today at 5.9, remains at goal. Discussed with patient has risk factor for diabetic neuropathy and if labs  are unremarkable, then we can consider trial of Gabapentin 100 mg. Pt verbalized understanding and agreeable. Stop-Bang questionnaire results indicate high risk for OSA. Patient will let me know if she decides to pursue sleep study.  ? ? ?Return in about 8 weeks (around 05/08/2022) for CPE.  ?   ? ? ? ?Lorrene Reid, PA-C  ?[facility] ?[phone] (phone) ?[phone] (fax) ? ?[facility] ?

## 2022-03-14 ENCOUNTER — Other Ambulatory Visit: Payer: Self-pay | Admitting: Physician Assistant

## 2022-03-14 DIAGNOSIS — R202 Paresthesia of skin: Secondary | ICD-10-CM

## 2022-03-14 LAB — CBC WITH DIFFERENTIAL/PLATELET
Basophils Absolute: 0 10*3/uL (ref 0.0–0.2)
Basos: 1 %
EOS (ABSOLUTE): 0.1 10*3/uL (ref 0.0–0.4)
Eos: 2 %
Hematocrit: 40.9 % (ref 34.0–46.6)
Hemoglobin: 13.7 g/dL (ref 11.1–15.9)
Immature Grans (Abs): 0 10*3/uL (ref 0.0–0.1)
Immature Granulocytes: 0 %
Lymphocytes Absolute: 2.6 10*3/uL (ref 0.7–3.1)
Lymphs: 35 %
MCH: 28.5 pg (ref 26.6–33.0)
MCHC: 33.5 g/dL (ref 31.5–35.7)
MCV: 85 fL (ref 79–97)
Monocytes Absolute: 0.5 10*3/uL (ref 0.1–0.9)
Monocytes: 7 %
Neutrophils Absolute: 4.1 10*3/uL (ref 1.4–7.0)
Neutrophils: 55 %
Platelets: 260 10*3/uL (ref 150–450)
RBC: 4.81 x10E6/uL (ref 3.77–5.28)
RDW: 13.2 % (ref 11.7–15.4)
WBC: 7.4 10*3/uL (ref 3.4–10.8)

## 2022-03-14 LAB — COMPREHENSIVE METABOLIC PANEL
ALT: 33 IU/L — ABNORMAL HIGH (ref 0–32)
AST: 29 IU/L (ref 0–40)
Albumin/Globulin Ratio: 1.7 (ref 1.2–2.2)
Albumin: 4.7 g/dL (ref 3.8–4.8)
Alkaline Phosphatase: 117 IU/L (ref 44–121)
BUN/Creatinine Ratio: 19 (ref 12–28)
BUN: 13 mg/dL (ref 8–27)
Bilirubin Total: 0.6 mg/dL (ref 0.0–1.2)
CO2: 26 mmol/L (ref 20–29)
Calcium: 9.8 mg/dL (ref 8.7–10.3)
Chloride: 102 mmol/L (ref 96–106)
Creatinine, Ser: 0.68 mg/dL (ref 0.57–1.00)
Globulin, Total: 2.8 g/dL (ref 1.5–4.5)
Glucose: 108 mg/dL — ABNORMAL HIGH (ref 70–99)
Potassium: 4.9 mmol/L (ref 3.5–5.2)
Sodium: 141 mmol/L (ref 134–144)
Total Protein: 7.5 g/dL (ref 6.0–8.5)
eGFR: 98 mL/min/{1.73_m2} (ref >59)

## 2022-03-14 LAB — TSH: TSH: 4.28 u[IU]/mL (ref 0.450–4.500)

## 2022-03-14 LAB — LIPID PANEL
Chol/HDL Ratio: 3.1 ratio (ref 0.0–4.4)
Cholesterol, Total: 176 mg/dL (ref 100–199)
HDL: 56 mg/dL (ref >39)
LDL Chol Calc (NIH): 99 mg/dL (ref 0–99)
Triglycerides: 121 mg/dL (ref 0–149)
VLDL Cholesterol Cal: 21 mg/dL (ref 5–40)

## 2022-03-14 LAB — B12 AND FOLATE PANEL
Folate: 18.7 ng/mL (ref >3.0)
Vitamin B-12: 546 pg/mL (ref 232–1245)

## 2022-03-14 LAB — SEDIMENTATION RATE: Sed Rate: 7 mm/hr (ref 0–40)

## 2022-03-14 MED ORDER — GABAPENTIN 100 MG PO CAPS: 100.0000 mg | ORAL_CAPSULE | Freq: Every day | ORAL | 1 refills | Status: DC

## 2022-04-25 ENCOUNTER — Other Ambulatory Visit: Payer: Self-pay | Admitting: Physician Assistant

## 2022-04-25 DIAGNOSIS — N6489 Other specified disorders of breast: Secondary | ICD-10-CM

## 2022-05-09 ENCOUNTER — Encounter: Payer: 59 | Admitting: Physician Assistant

## 2022-05-09 ENCOUNTER — Telehealth: Payer: Self-pay | Admitting: Physician Assistant

## 2022-05-09 NOTE — Telephone Encounter (Signed)
[facility] called and stated you needed to co-sign an order for patient's diagnostic mammogram/ultrasound and you can do that through epic. Her appointment is tomorrow 06/23 @ 2:40.

## 2022-05-10 ENCOUNTER — Ambulatory Visit
Admission: RE | Admit: 2022-05-10 | Discharge: 2022-05-10 | Disposition: A | Discharge status: Home / Self Care | Payer: 59 | Source: Ambulatory Visit | Attending: Physician Assistant | Admitting: Physician Assistant

## 2022-05-10 ENCOUNTER — Ambulatory Visit: Admission: RE | Admit: 2022-05-10 | Payer: 59 | Source: Ambulatory Visit

## 2022-05-10 DIAGNOSIS — N6489 Other specified disorders of breast: Secondary | ICD-10-CM

## 2022-08-12 LAB — HM MAMMOGRAPHY

## 2022-08-14 ENCOUNTER — Encounter: Payer: Self-pay | Admitting: Physician Assistant

## 2023-01-30 IMAGING — MG MM DIGITAL DIAGNOSTIC UNILAT*L* W/ TOMO W/ CAD
8 series · 8 of 24 positions shown · non-contrast
Comparison: Previous exam(s).

CLINICAL DATA: Six-month follow-up after stereotactic biopsy of
LEFT breast showing benign breast tissue with subtle adenosis and
fibrocystic changes.

EXAM:
DIGITAL DIAGNOSTIC UNILATERAL LEFT MAMMOGRAM WITH TOMOSYNTHESIS AND
CAD
TECHNIQUE: Left digital diagnostic mammography and breast tomosynthesis was
performed. The images were evaluated with computer-aided detection.

[L CC synth-2D (1 of 2)]
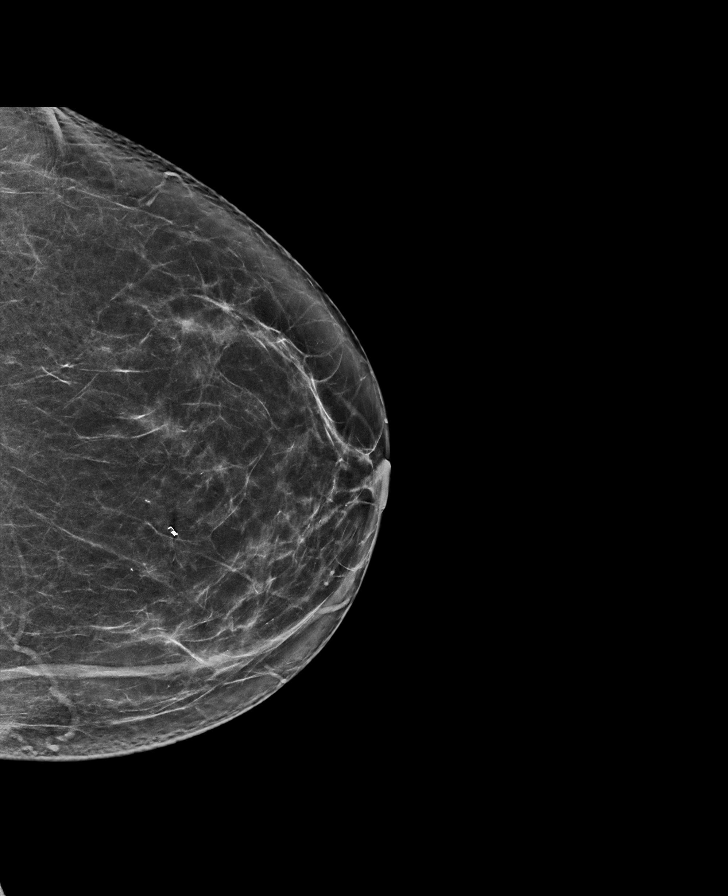

[L MLO synth-2D (1 of 2)]
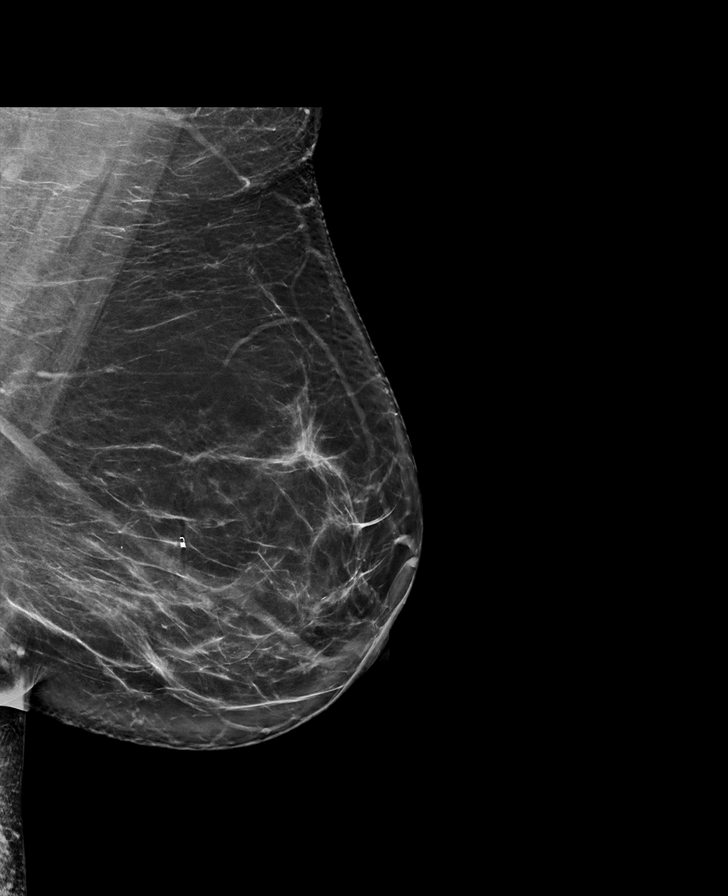

[L MLO synth-2D (2 of 2)]
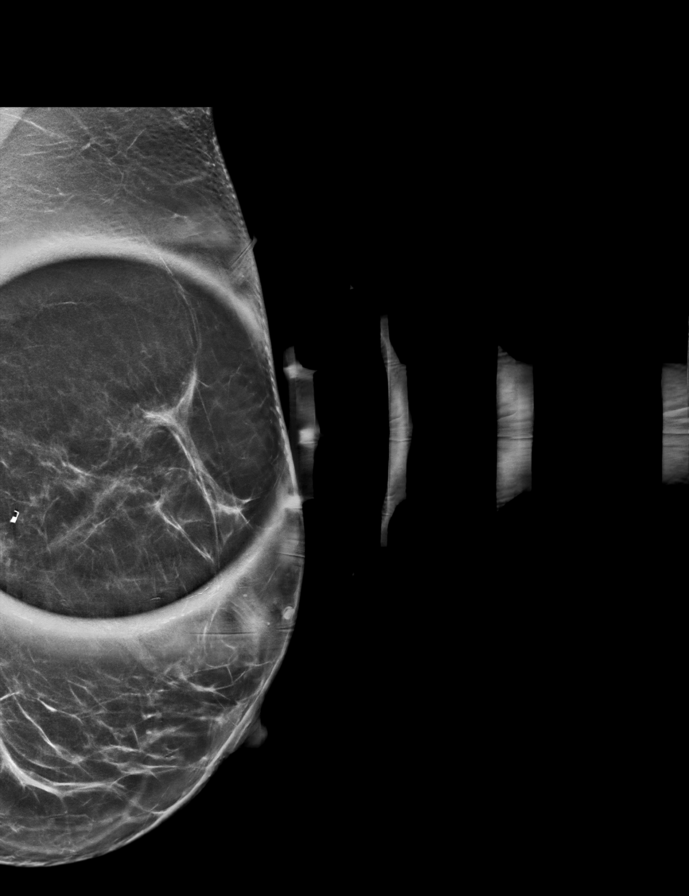

[L CC synth-2D (2 of 2)]
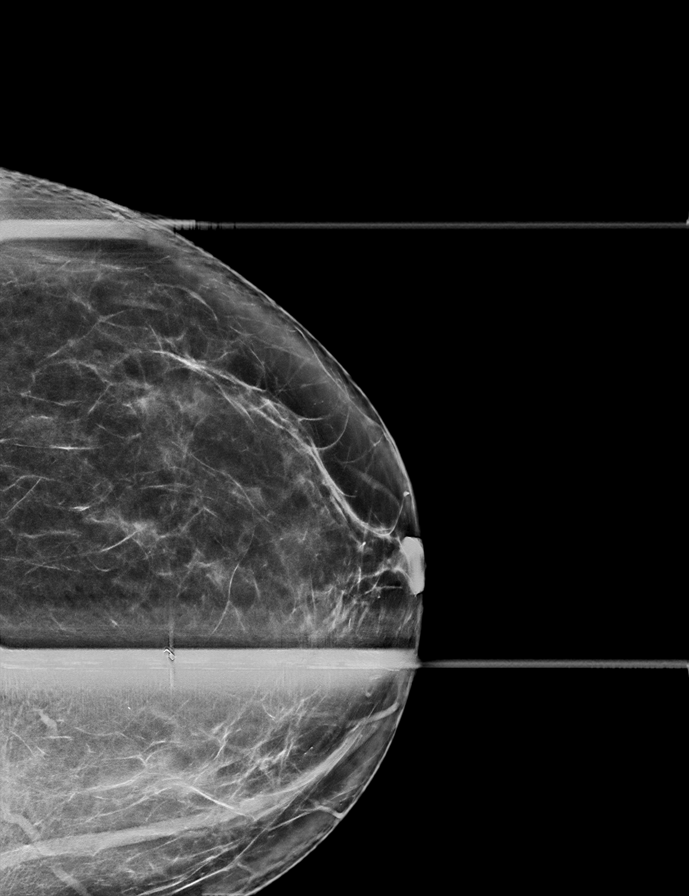

[L MLO tomo (1 of 2) · tomo slice 47/93.0]
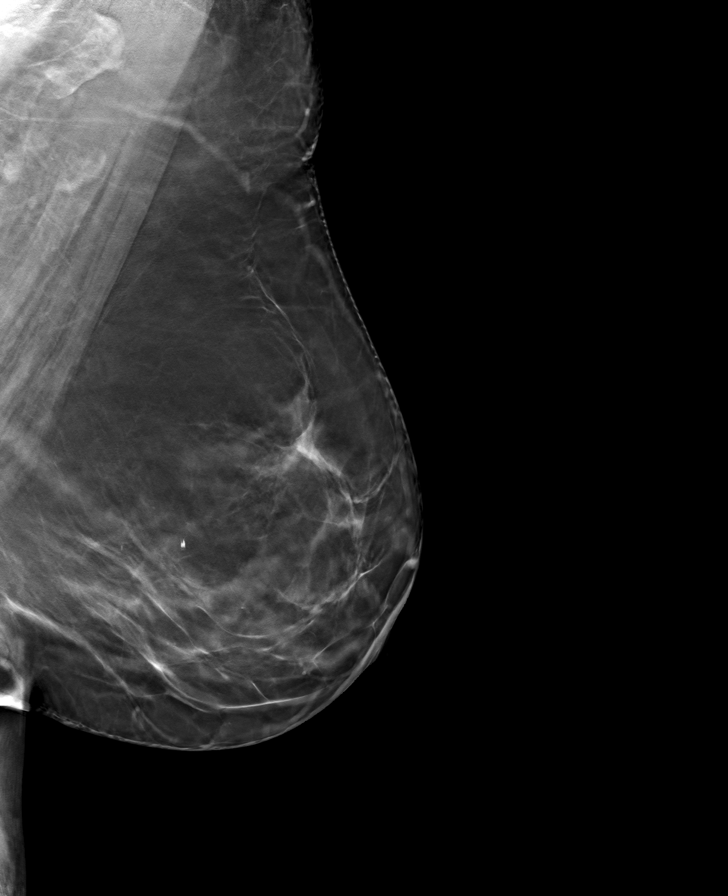

[L CC tomo (1 of 2) · tomo slice 40/79.0]
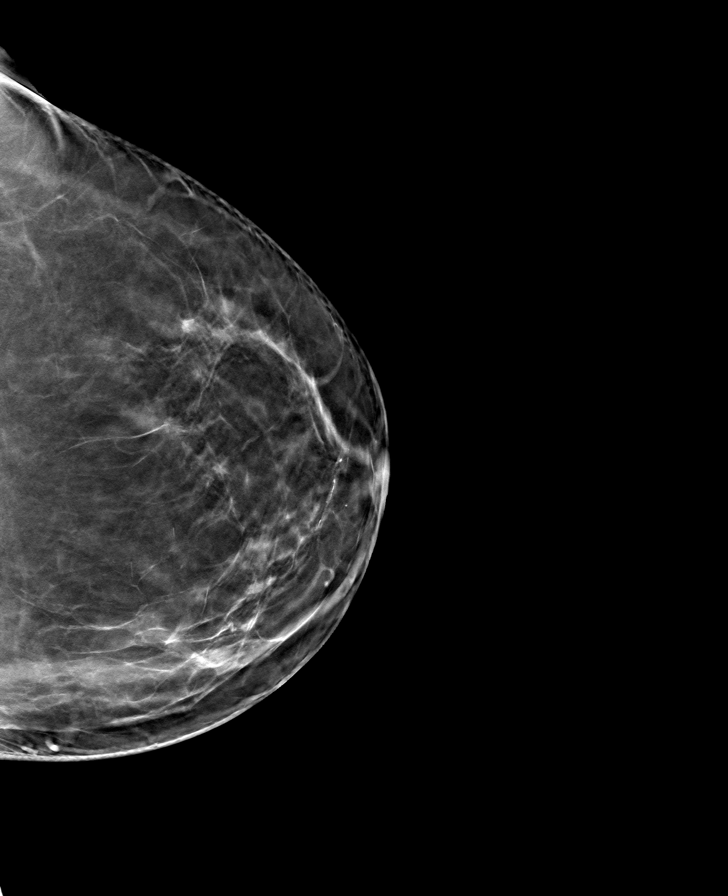

[L CC tomo (2 of 2) · tomo slice 39/76.0]
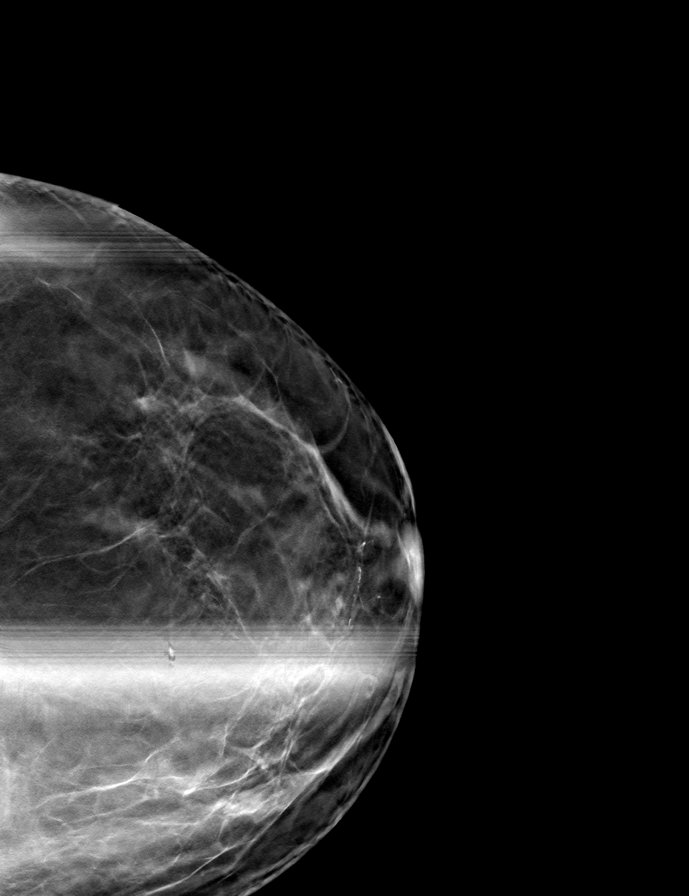

[L MLO tomo (2 of 2) · tomo slice 39/76.0]
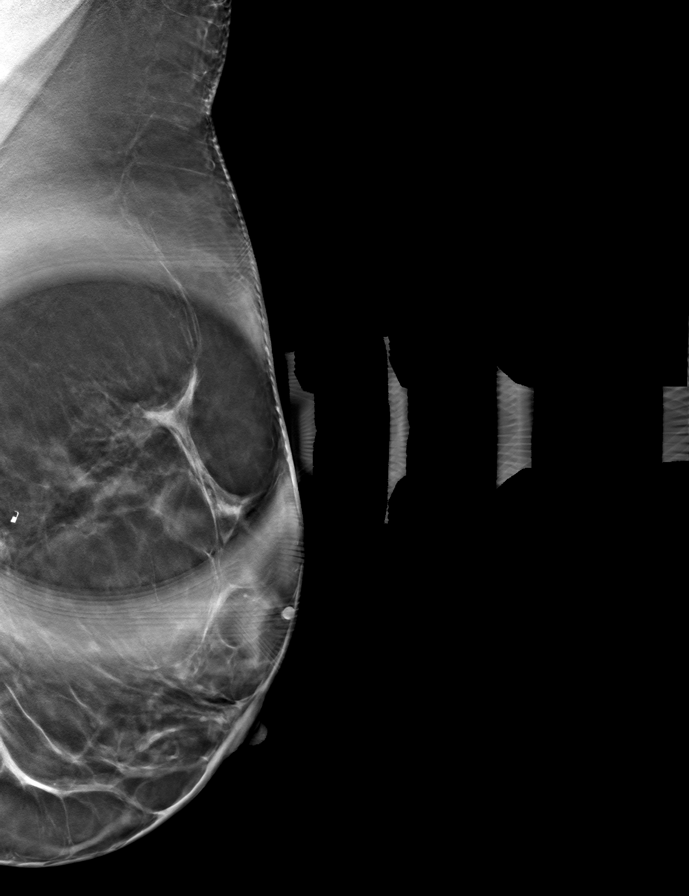

[8 of 24 positions shown; findings below may reference images not displayed]

ACR Breast Density Category b: There are scattered areas of
fibroglandular density.
FINDINGS: No suspicious mass, distortion, or microcalcifications are
identified to suggest presence of malignancy. Spot compression views
of the UPPER-OUTER QUADRANT of the LEFT breast confirm no distortion
or mass.
IMPRESSION: No mammographic evidence for malignancy.

RECOMMENDATION:
Recommend screening mammogram in Saturday June, 2022.

I have discussed the findings and recommendations with the patient.
If applicable, a reminder letter will be sent to the patient
regarding the next appointment.

BI-RADS CATEGORY  1: Negative.

## 2023-04-10 ENCOUNTER — Ambulatory Visit: Payer: 59 | Admitting: Family Medicine

## 2023-04-10 ENCOUNTER — Encounter: Payer: Self-pay | Admitting: Family Medicine

## 2023-04-10 VITALS — BP 116/69 | HR 59 | Resp 18 | Ht 63.0 in | Wt 158.0 lb

## 2023-04-10 DIAGNOSIS — I152 Hypertension secondary to endocrine disorders: Secondary | ICD-10-CM

## 2023-04-10 DIAGNOSIS — E1169 Type 2 diabetes mellitus with other specified complication: Secondary | ICD-10-CM

## 2023-04-10 DIAGNOSIS — E1159 Type 2 diabetes mellitus with other circulatory complications: Secondary | ICD-10-CM | POA: Diagnosis not present

## 2023-04-10 DIAGNOSIS — E782 Mixed hyperlipidemia: Secondary | ICD-10-CM

## 2023-04-10 DIAGNOSIS — Z780 Asymptomatic menopausal state: Secondary | ICD-10-CM

## 2023-04-10 DIAGNOSIS — E559 Vitamin D deficiency, unspecified: Secondary | ICD-10-CM

## 2023-04-10 DIAGNOSIS — R7989 Other specified abnormal findings of blood chemistry: Secondary | ICD-10-CM

## 2023-04-10 LAB — POCT GLYCOSYLATED HEMOGLOBIN (HGB A1C): HbA1c POC (<> result, manual entry): 5.7 % (ref 4.0–5.6)

## 2023-04-10 NOTE — Assessment & Plan Note (Signed)
Last lipid panel: LDL 99, HDL 56, triglycerides 121.  Repeating lipid panel today.  If LDL still above goal of 70, recommend to restart statin medication.  Continue nutrition and physical activity goals.

## 2023-04-10 NOTE — Assessment & Plan Note (Signed)
Previously treated with levothyroxine.  Patient does not have any symptoms at the moment.  Checking thyroid function labs, will treat as indicated by results.

## 2023-04-10 NOTE — Patient Instructions (Signed)
I will send you a note about your lab results once I have a chance to look at them.   Please note that official guidelines do not recommend a bone mineral density scan for screening purposes until 64 years old.  It may be worthwhile to call your insurance to see if they will pay for one for you right now.  At the very least, we will check your vitamin D and calcium levels and can supplement them if necessary.

## 2023-04-10 NOTE — Assessment & Plan Note (Signed)
Checking vitamin D today.  If low, recommend starting vitamin D supplement.  We discussed the importance of supplementing vitamin D and calcium for bone health after menopause.  Patient is in agreement, pending lab results will start either prescription strength or over-the-counter vitamin D.

## 2023-04-10 NOTE — Progress Notes (Signed)
Acute Office Visit  Subjective:     Patient ID: Diane Carter, female    DOB: 1961-05-17, 62 y.o.   MRN: [id number]  Chief Complaint  Patient presents with   Diabetes   Hypothyroidism    HPI Patient is in today for care, she has not been to the office in more than 1 year.  History of hypertension, type 2 diabetes, hyperlipidemia, hypothyroidism.  She has discontinued all medications: Amlodipine 2.5 mg, losartan 50 mg, metformin 250 mg, atorvastatin 40 mg, levothyroxine 25 mcg, gabapentin 100 mg.  She has lost about 30 pounds in the last 6 years which has greatly improved her health.  She states that she feels great and is very happy with her progress.  Review of Systems  Constitutional:  Positive for weight loss (Intentional). Negative for chills, fever and malaise/fatigue.  Respiratory:  Negative for cough and shortness of breath.   Cardiovascular:  Negative for chest pain and palpitations.  Gastrointestinal:  Negative for abdominal pain, constipation, diarrhea, heartburn, nausea and vomiting.  Musculoskeletal:  Negative for back pain, joint pain, myalgias and neck pain.  Neurological:  Negative for dizziness, sensory change and headaches.  Psychiatric/Behavioral:  Negative for depression. The patient is not nervous/anxious and does not have insomnia.      Objective:    BP 116/69 (BP Location: Left Arm, Patient Position: Sitting, Cuff Size: Normal)   Pulse (!) 59   Resp 18   Ht 5\' 3"  (1.6 m)   Wt 158 lb (71.7 kg)   SpO2 100%   BMI 27.99 kg/m   Physical Exam Constitutional:      General: She is not in acute distress.    Appearance: Normal appearance.  HENT:     Head: Normocephalic and atraumatic.  Cardiovascular:     Rate and Rhythm: Normal rate and regular rhythm.     Pulses: Normal pulses.     Heart sounds: No murmur heard.    No friction rub. No gallop.  Pulmonary:     Effort: Pulmonary effort is normal. No respiratory distress.     Breath sounds: No wheezing,  rhonchi or rales.  Skin:    General: Skin is warm and dry.  Neurological:     Mental Status: She is alert and oriented to person, place, and time.    Results for orders placed or performed in visit on 04/10/23  POCT HgB A1C  Result Value Ref Range   Hemoglobin A1C     HbA1c POC (<> result, manual entry) 5.7 4.0 - 5.6 %   HbA1c, POC (prediabetic range)     HbA1c, POC (controlled diabetic range)       Assessment & Plan:  Type 2 diabetes mellitus with other circulatory complications ([name]) Assessment & Plan: A1c 5.7.  Patient at goal of less than 7.0.  We discussed that she will still need all preventative care for diabetes, but essentially she has type 2 diabetes mellitus that is mostly in remission.  Continue managing with nutrition and physical activity.  Will continue to monitor.  Orders: -     POCT glycosylated hemoglobin (Hb A1C) -     CBC with Differential/Platelet; Future -     Comprehensive metabolic panel; Future -     Lipid panel; Future -     VITAMIN D 25 Hydroxy (Vit-D Deficiency, Fractures); Future -     TSH; Future -     T4, free; Future  Hypertension associated with diabetes [facility]) Assessment & Plan: Blood pressure  fantastic at 116/69 today.  Continue low-salt diet and routine physical activity.  Will continue to monitor.   Mixed diabetic hyperlipidemia associated with type 2 diabetes mellitus ([name]) Assessment & Plan: Last lipid panel: LDL 99, HDL 56, triglycerides 121.  Repeating lipid panel today.  If LDL still above goal of 70, recommend to restart statin medication.  Continue nutrition and physical activity goals.  Orders: -     Lipid panel; Future  Vitamin D deficiency Assessment & Plan: Checking vitamin D today.  If low, recommend starting vitamin D supplement.  We discussed the importance of supplementing vitamin D and calcium for bone health after menopause.  Patient is in agreement, pending lab results will start either prescription strength or  over-the-counter vitamin D.  Orders: -     VITAMIN D 25 Hydroxy (Vit-D Deficiency, Fractures); Future  Elevated TSH-  with significant fatigue and symptoms Assessment & Plan: Previously treated with levothyroxine.  Patient does not have any symptoms at the moment.  Checking thyroid function labs, will treat as indicated by results.  Orders: -     TSH; Future -     T4, free; Future  Postmenopausal estrogen deficiency -     DG Bone Density; Future    Return in about 4 months (around 08/11/2023) for annual physical, fasting blood work 1 week before.  Melida Quitter, PA

## 2023-04-10 NOTE — Assessment & Plan Note (Signed)
A1c 5.7.  Patient at goal of less than 7.0.  We discussed that she will still need all preventative care for diabetes, but essentially she has type 2 diabetes mellitus that is mostly in remission.  Continue managing with nutrition and physical activity.  Will continue to monitor.

## 2023-04-10 NOTE — Assessment & Plan Note (Signed)
Blood pressure fantastic at 116/69 today.  Continue low-salt diet and routine physical activity.  Will continue to monitor.

## 2023-04-11 LAB — COMPREHENSIVE METABOLIC PANEL
ALT: 10 IU/L (ref 0–32)
AST: 15 IU/L (ref 0–40)
Albumin/Globulin Ratio: 1.6 (ref 1.2–2.2)
Albumin: 4.4 g/dL (ref 3.9–4.9)
Alkaline Phosphatase: 86 IU/L (ref 44–121)
BUN/Creatinine Ratio: 21 (ref 12–28)
BUN: 15 mg/dL (ref 8–27)
Bilirubin Total: 0.4 mg/dL (ref 0.0–1.2)
CO2: 23 mmol/L (ref 20–29)
Calcium: 9.6 mg/dL (ref 8.7–10.3)
Chloride: 104 mmol/L (ref 96–106)
Creatinine, Ser: 0.7 mg/dL (ref 0.57–1.00)
Globulin, Total: 2.8 g/dL (ref 1.5–4.5)
Glucose: 90 mg/dL (ref 70–99)
Potassium: 4.3 mmol/L (ref 3.5–5.2)
Sodium: 142 mmol/L (ref 134–144)
Total Protein: 7.2 g/dL (ref 6.0–8.5)
eGFR: 97 mL/min/{1.73_m2} (ref >59)

## 2023-04-11 LAB — CBC WITH DIFFERENTIAL/PLATELET
Basophils Absolute: 0 10*3/uL (ref 0.0–0.2)
Basos: 1 %
EOS (ABSOLUTE): 0.1 10*3/uL (ref 0.0–0.4)
Eos: 2 %
Hematocrit: 39.8 % (ref 34.0–46.6)
Hemoglobin: 12.9 g/dL (ref 11.1–15.9)
Immature Grans (Abs): 0 10*3/uL (ref 0.0–0.1)
Immature Granulocytes: 0 %
Lymphocytes Absolute: 2.3 10*3/uL (ref 0.7–3.1)
Lymphs: 40 %
MCH: 28 pg (ref 26.6–33.0)
MCHC: 32.4 g/dL (ref 31.5–35.7)
MCV: 87 fL (ref 79–97)
Monocytes Absolute: 0.4 10*3/uL (ref 0.1–0.9)
Monocytes: 8 %
Neutrophils Absolute: 2.9 10*3/uL (ref 1.4–7.0)
Neutrophils: 49 %
Platelets: 263 10*3/uL (ref 150–450)
RBC: 4.6 x10E6/uL (ref 3.77–5.28)
RDW: 13 % (ref 11.7–15.4)
WBC: 5.8 10*3/uL (ref 3.4–10.8)

## 2023-04-11 LAB — T4, FREE: Free T4: 0.84 ng/dL (ref 0.82–1.77)

## 2023-04-11 LAB — LIPID PANEL
Chol/HDL Ratio: 4.1 ratio (ref 0.0–4.4)
Cholesterol, Total: 211 mg/dL — ABNORMAL HIGH (ref 100–199)
HDL: 51 mg/dL (ref >39)
LDL Chol Calc (NIH): 140 mg/dL — ABNORMAL HIGH (ref 0–99)
Triglycerides: 111 mg/dL (ref 0–149)
VLDL Cholesterol Cal: 20 mg/dL (ref 5–40)

## 2023-04-11 LAB — TSH: TSH: 3.94 u[IU]/mL (ref 0.450–4.500)

## 2023-04-11 LAB — VITAMIN D 25 HYDROXY (VIT D DEFICIENCY, FRACTURES): Vit D, 25-Hydroxy: 31 ng/mL (ref 30.0–100.0)

## 2023-05-01 LAB — HM PAP SMEAR

## 2023-05-05 ENCOUNTER — Encounter: Payer: Self-pay | Admitting: Family Medicine

## 2023-05-21 ENCOUNTER — Telehealth: Payer: Self-pay | Admitting: *Deleted

## 2023-05-21 NOTE — Telephone Encounter (Signed)
LVM to call office.  Wanting to confirm if she was able to get her bone density.

## 2023-05-29 NOTE — Telephone Encounter (Signed)
Pt returned call and she is going to touch base with the imaging site to see about scheduling this and will then call if there is a problem

## 2023-08-28 ENCOUNTER — Other Ambulatory Visit: Payer: Self-pay

## 2023-08-28 DIAGNOSIS — E1159 Type 2 diabetes mellitus with other circulatory complications: Secondary | ICD-10-CM

## 2023-08-28 DIAGNOSIS — E559 Vitamin D deficiency, unspecified: Secondary | ICD-10-CM

## 2023-08-28 DIAGNOSIS — E1169 Type 2 diabetes mellitus with other specified complication: Secondary | ICD-10-CM

## 2023-08-28 DIAGNOSIS — R7989 Other specified abnormal findings of blood chemistry: Secondary | ICD-10-CM

## 2023-08-28 DIAGNOSIS — I152 Hypertension secondary to endocrine disorders: Secondary | ICD-10-CM

## 2023-09-09 ENCOUNTER — Other Ambulatory Visit: Payer: 59

## 2023-09-16 ENCOUNTER — Encounter: Payer: 59 | Admitting: Family Medicine

## 2023-10-06 ENCOUNTER — Other Ambulatory Visit: Payer: Self-pay | Admitting: Family Medicine

## 2023-10-06 DIAGNOSIS — E1169 Type 2 diabetes mellitus with other specified complication: Secondary | ICD-10-CM

## 2023-10-06 DIAGNOSIS — E1159 Type 2 diabetes mellitus with other circulatory complications: Secondary | ICD-10-CM

## 2023-10-10 ENCOUNTER — Other Ambulatory Visit: Payer: 59

## 2023-10-10 DIAGNOSIS — E1169 Type 2 diabetes mellitus with other specified complication: Secondary | ICD-10-CM

## 2023-10-10 DIAGNOSIS — I152 Hypertension secondary to endocrine disorders: Secondary | ICD-10-CM

## 2023-10-10 DIAGNOSIS — Z780 Asymptomatic menopausal state: Secondary | ICD-10-CM

## 2023-10-11 LAB — COMPREHENSIVE METABOLIC PANEL
ALT: 10 [IU]/L (ref 0–32)
AST: 17 [IU]/L (ref 0–40)
Albumin: 4.5 g/dL (ref 3.9–4.9)
Alkaline Phosphatase: 103 [IU]/L (ref 44–121)
BUN/Creatinine Ratio: 12 (ref 12–28)
BUN: 10 mg/dL (ref 8–27)
Bilirubin Total: 0.5 mg/dL (ref 0.0–1.2)
CO2: 24 mmol/L (ref 20–29)
Calcium: 9.6 mg/dL (ref 8.7–10.3)
Chloride: 104 mmol/L (ref 96–106)
Creatinine, Ser: 0.84 mg/dL (ref 0.57–1.00)
Globulin, Total: 2.8 g/dL (ref 1.5–4.5)
Glucose: 88 mg/dL (ref 70–99)
Potassium: 4.1 mmol/L (ref 3.5–5.2)
Sodium: 144 mmol/L (ref 134–144)
Total Protein: 7.3 g/dL (ref 6.0–8.5)
eGFR: 78 mL/min/{1.73_m2} (ref >59)

## 2023-10-11 LAB — LIPID PANEL
Chol/HDL Ratio: 3.2 ratio (ref 0.0–4.4)
Cholesterol, Total: 217 mg/dL — ABNORMAL HIGH (ref 100–199)
HDL: 68 mg/dL (ref >39)
LDL Chol Calc (NIH): 124 mg/dL — ABNORMAL HIGH (ref 0–99)
Triglycerides: 142 mg/dL (ref 0–149)
VLDL Cholesterol Cal: 25 mg/dL (ref 5–40)

## 2023-10-24 ENCOUNTER — Ambulatory Visit (INDEPENDENT_AMBULATORY_CARE_PROVIDER_SITE_OTHER): Payer: 59 | Admitting: Family Medicine

## 2023-10-24 ENCOUNTER — Encounter: Payer: Self-pay | Admitting: Family Medicine

## 2023-10-24 VITALS — BP 155/72 | HR 81 | Resp 18 | Ht 63.0 in | Wt 172.0 lb

## 2023-10-24 DIAGNOSIS — E1159 Type 2 diabetes mellitus with other circulatory complications: Secondary | ICD-10-CM | POA: Diagnosis not present

## 2023-10-24 DIAGNOSIS — E782 Mixed hyperlipidemia: Secondary | ICD-10-CM

## 2023-10-24 DIAGNOSIS — I152 Hypertension secondary to endocrine disorders: Secondary | ICD-10-CM

## 2023-10-24 DIAGNOSIS — E559 Vitamin D deficiency, unspecified: Secondary | ICD-10-CM

## 2023-10-24 DIAGNOSIS — Z0000 Encounter for general adult medical examination without abnormal findings: Secondary | ICD-10-CM

## 2023-10-24 DIAGNOSIS — Z780 Asymptomatic menopausal state: Secondary | ICD-10-CM

## 2023-10-24 DIAGNOSIS — Z1159 Encounter for screening for other viral diseases: Secondary | ICD-10-CM

## 2023-10-24 DIAGNOSIS — R7989 Other specified abnormal findings of blood chemistry: Secondary | ICD-10-CM | POA: Diagnosis not present

## 2023-10-24 DIAGNOSIS — E1169 Type 2 diabetes mellitus with other specified complication: Secondary | ICD-10-CM | POA: Diagnosis not present

## 2023-10-24 LAB — POCT GLYCOSYLATED HEMOGLOBIN (HGB A1C): Hemoglobin A1C: 6 % — AB (ref 4.0–5.6)

## 2023-10-24 LAB — POCT UA - MICROALBUMIN
Creatinine, POC: 50 mg/dL
Microalbumin Ur, POC: 10 mg/L

## 2023-10-24 NOTE — Assessment & Plan Note (Addendum)
A1c 6.0.  Patient at goal of less than 7.0. Continue managing with nutrition and physical activity.  Will continue to monitor.

## 2023-10-24 NOTE — Patient Instructions (Addendum)
HOMEWORK: -eye exam -check your blood pressure at home twice daily for the next week and send me a picture of your blood pressure log

## 2023-10-24 NOTE — Assessment & Plan Note (Addendum)
BP goal <130/80.  Blood pressure elevated today, patient will check blood pressure at home and keep a log for the next week.  Continue low-salt diet and routine physical activity.  Will continue to monitor.

## 2023-10-24 NOTE — Progress Notes (Signed)
Complete physical exam  Patient: Diane Carter   DOB: April 21, 1961   62 y.o. Female  MRN: [id number]  Subjective:    Chief Complaint  Patient presents with   Annual Exam    Diane Carter is a 62 y.o. female who presents today for a complete physical exam. She reports consuming a general diet.  She struggles with cravings for sugar.  She is not currently in a physical activity routine but would like to get back to using a walking pad at work.  She generally feels well. She reports sleeping well. She does not have additional problems to discuss today.  She is requesting that the DEXA scan be sent to [facility] as that is more convenient for her.   Most recent fall risk assessment:    10/24/2023    8:45 AM  Fall Risk   Falls in the past year? 0  Number falls in past yr: 0  Injury with Fall? 0  Risk for fall due to : No Fall Risks  Follow up Falls evaluation completed     Most recent depression and anxiety screenings:    10/24/2023    8:46 AM 04/10/2023    8:48 AM  PHQ 2/9 Scores  PHQ - 2 Score 0 0  PHQ- 9 Score 3       10/24/2023    8:46 AM 03/13/2022    8:54 AM 08/22/2021   10:03 AM 04/15/2019    8:13 AM  GAD 7 : Generalized Anxiety Score  Nervous, Anxious, on Edge 0 0 0 0  Control/stop worrying 0 0 0 0  Worry too much - different things 0 0 0 0  Trouble relaxing 0 0 0 0  Restless 0 0 0 0  Easily annoyed or irritable 0 0 0 0  Afraid - awful might happen 0 0 0 0  Total GAD 7 Score 0 0 0 0  Anxiety Difficulty Not difficult at all Not difficult at all  Not difficult at all    Patient Active Problem List   Diagnosis Date Noted   Abnormal cervical Papanicolaou smear 02/15/2020   Anemia 02/15/2020   Migraine 02/15/2020   Osteopenia 02/15/2020   Hypertension associated with diabetes ([name]) 12/11/2018   Mixed diabetic hyperlipidemia associated with type 2 diabetes mellitus ([name]) 12/11/2018   Elevated TSH-  with significant fatigue and symptoms 12/11/2018   Vitamin D  deficiency 12/11/2018   Chronic fatigue 12/11/2018   Type 2 diabetes mellitus with other circulatory complications ([name]) 12/11/2018   Salt craving/  eats a lot of salt 10/02/2018    Past Surgical History:  Procedure Laterality Date   CESAREAN SECTION  1995   LIVER RESECTION  1992   Social History   Tobacco Use   Smoking status: Never    Passive exposure: Never   Smokeless tobacco: Never  Vaping Use   Vaping status: Never Used  Substance Use Topics   Alcohol use: Never   Drug use: Never   Family History  Problem Relation Age of Onset   Liver cancer Father    Alcohol abuse Father    Diabetes type I Paternal Aunt    Heart attack Other    No Known Allergies   Patient Care Team: Melida Quitter, PA as PCP - General (Family Medicine) Harold Hedge, MD as Consulting Physician (Obstetrics and Gynecology) Gastroenterology, [facility], [facility]   No outpatient medications prior to visit.   No facility-administered medications prior to visit.  Review of Systems  Constitutional:  Negative for chills, fever and malaise/fatigue.  HENT:  Negative for congestion and hearing loss.   Eyes:  Negative for blurred vision and double vision.  Respiratory:  Negative for cough and shortness of breath.   Cardiovascular:  Negative for chest pain, palpitations and leg swelling.  Gastrointestinal:  Negative for abdominal pain, constipation, diarrhea and heartburn.  Genitourinary:  Negative for frequency and urgency.  Musculoskeletal:  Negative for myalgias and neck pain.  Neurological:  Negative for headaches.  Endo/Heme/Allergies:  Negative for polydipsia.  Psychiatric/Behavioral:  Negative for depression. The patient is not nervous/anxious and does not have insomnia.       Objective:    BP (!) 155/72 (BP Location: Left Arm, Patient Position: Sitting, Cuff Size: Normal)   Pulse 81   Resp 18   Ht 5\' 3"  (1.6 m)   Wt 172 lb (78 kg)   SpO2 97%   BMI 30.47 kg/m     Physical Exam Constitutional:      General: She is not in acute distress.    Appearance: Normal appearance.  HENT:     Head: Normocephalic and atraumatic.     Right Ear: Tympanic membrane, ear canal and external ear normal. There is no impacted cerumen.     Left Ear: Tympanic membrane, ear canal and external ear normal. There is no impacted cerumen.     Nose: Nose normal. No congestion or rhinorrhea.     Mouth/Throat:     Mouth: Mucous membranes are moist.     Pharynx: No oropharyngeal exudate or posterior oropharyngeal erythema.  Eyes:     Extraocular Movements: Extraocular movements intact.     Conjunctiva/sclera: Conjunctivae normal.     Pupils: Pupils are equal, round, and reactive to light.     Comments: Wearing glasses, needs updated eye exam  Neck:     Thyroid: No thyroid mass, thyromegaly or thyroid tenderness.  Cardiovascular:     Rate and Rhythm: Normal rate and regular rhythm.     Heart sounds: Normal heart sounds. No murmur heard.    No friction rub. No gallop.  Pulmonary:     Effort: Pulmonary effort is normal. No respiratory distress.     Breath sounds: Normal breath sounds. No wheezing, rhonchi or rales.  Abdominal:     General: Abdomen is flat. Bowel sounds are normal. There is no distension.     Palpations: There is no mass.     Tenderness: There is no abdominal tenderness. There is no guarding.  Musculoskeletal:        General: Normal range of motion.     Cervical back: Normal range of motion and neck supple.  Lymphadenopathy:     Cervical: No cervical adenopathy.  Skin:    General: Skin is warm and dry.  Neurological:     Mental Status: She is alert and oriented to person, place, and time.     Cranial Nerves: No cranial nerve deficit.     Motor: No weakness.     Deep Tendon Reflexes: Reflexes normal.  Psychiatric:        Mood and Affect: Mood normal.    Results for orders placed or performed in visit on 10/24/23  POCT HgB A1C  Result Value Ref  Range   Hemoglobin A1C 6.0 (A) 4.0 - 5.6 %   HbA1c POC (<> result, manual entry)     HbA1c, POC (prediabetic range)     HbA1c, POC (controlled diabetic range)  POCT UA - Microalbumin  Result Value Ref Range   Microalbumin Ur, POC 10 mg/L   Creatinine, POC 50 mg/dL   Albumin/Creatinine Ratio, Urine, POC 30-300      Assessment & Plan:    Routine Health Maintenance and Physical Exam  Immunization History  Administered Date(s) Administered   Tdap 01/04/2020   Zoster Recombinant(Shingrix) 02/07/2019, 09/23/2019    Health Maintenance  Topic Date Due   HIV Screening  Never done   OPHTHALMOLOGY EXAM  01/29/2020   Cervical Cancer Screening (HPV/Pap Cotest)  06/03/2020   FOOT EXAM  07/19/2021   COVID-19 Vaccine (1 - 2023-24 season) 11/09/2023 (Originally 07/20/2023)   INFLUENZA VACCINE  02/16/2024 (Originally 06/19/2023)   HEMOGLOBIN A1C  04/23/2024   MAMMOGRAM  08/12/2024   Diabetic kidney evaluation - eGFR measurement  10/09/2024   Diabetic kidney evaluation - Urine ACR  10/23/2024   Colonoscopy  01/14/2026   DTaP/Tdap/Td (2 - Td or Tdap) 01/03/2030   Hepatitis C Screening  Completed   Zoster Vaccines- Shingrix  Completed   HPV VACCINES  Aged Out    Reviewed most recent labs including CBC, CMP, lipid panel, A1C, TSH, and vitamin D. All within normal limits/stable from last check other than LDL still elevated but has improved to 124.  Discussed health benefits of physical activity, and encouraged her to engage in regular exercise appropriate for her age and condition.  Wellness examination  Hypertension associated with diabetes ([name]) Assessment & Plan: BP goal <130/80.  Blood pressure elevated today, patient will check blood pressure at home and keep a log for the next week.  Continue low-salt diet and routine physical activity.  Will continue to monitor.  Orders: -     CBC with Differential/Platelet; Future -     Comprehensive metabolic panel; Future  Mixed diabetic  hyperlipidemia associated with type 2 diabetes mellitus ([name]) Assessment & Plan: Last lipid panel: LDL 124, HDL 68, triglycerides 142.  LDL has improved from 140.  Continue nutrition and physical activity goals.  If LDL remains above 100 at 36-month appointment, patient is open to discussing cholesterol medication.  Orders: -     CBC with Differential/Platelet; Future -     Comprehensive metabolic panel; Future -     Lipid panel; Future  Type 2 diabetes mellitus with other circulatory complications ([name]) Assessment & Plan: A1c 6.0.  Patient at goal of less than 7.0. Continue managing with nutrition and physical activity.  Will continue to monitor.  Orders: -     POCT glycosylated hemoglobin (Hb A1C) -     POCT UA - Microalbumin -     CBC with Differential/Platelet; Future -     Comprehensive metabolic panel; Future -     Hemoglobin A1c; Future  Elevated TSH-  with significant fatigue and symptoms -     TSH Rfx on Abnormal to Free T4; Future  Screening for viral disease -     HIV Antibody (routine testing w rflx); Future  Vitamin D deficiency -     VITAMIN D 25 Hydroxy (Vit-D Deficiency, Fractures); Future  Postmenopausal estrogen deficiency -     DG Bone Density; Future    Return in about 6 months (around 04/23/2024) for follow-up for HTN, HLD, DM, fasting blood work 1 week before, urine microalb and foot at visit.     Melida Quitter, PA

## 2023-10-24 NOTE — Assessment & Plan Note (Addendum)
Last lipid panel: LDL 124, HDL 68, triglycerides 142.  LDL has improved from 140.  Continue nutrition and physical activity goals.  If LDL remains above 100 at 66-month appointment, patient is open to discussing cholesterol medication.

## 2023-10-31 ENCOUNTER — Encounter: Payer: Self-pay | Admitting: Family Medicine

## 2023-12-25 ENCOUNTER — Encounter: Payer: Self-pay | Admitting: Family Medicine

## 2024-07-28 ENCOUNTER — Other Ambulatory Visit
